# Patient Record
Sex: Male | Born: 1980 | Hispanic: Yes | Marital: Single | Smoking: Former smoker
Health system: Southern US, Community
[De-identification: ages and names within clinical notes are randomized; demographics above are authoritative.]

## PROBLEM LIST (undated history)

## (undated) DIAGNOSIS — H548 Legal blindness, as defined in USA: Secondary | ICD-10-CM

## (undated) DIAGNOSIS — K4090 Unilateral inguinal hernia, without obstruction or gangrene, not specified as recurrent: Secondary | ICD-10-CM

## (undated) DIAGNOSIS — B009 Herpesviral infection, unspecified: Secondary | ICD-10-CM

## (undated) DIAGNOSIS — G039 Meningitis, unspecified: Secondary | ICD-10-CM

## (undated) HISTORY — PX: EYE SURGERY: SHX253

## (undated) HISTORY — DX: Legal blindness, as defined in USA: H54.8

## (undated) HISTORY — PX: BACK SURGERY: SHX140

## (undated) HISTORY — PX: INGUINAL HERNIA REPAIR: SUR1180

## (undated) HISTORY — PX: WISDOM TOOTH EXTRACTION: SHX21

---

## 2014-06-14 DIAGNOSIS — B009 Herpesviral infection, unspecified: Secondary | ICD-10-CM | POA: Insufficient documentation

## 2014-06-14 DIAGNOSIS — H5440 Blindness, one eye, unspecified eye: Secondary | ICD-10-CM | POA: Insufficient documentation

## 2014-10-05 DIAGNOSIS — L301 Dyshidrosis [pompholyx]: Secondary | ICD-10-CM | POA: Insufficient documentation

## 2014-10-05 DIAGNOSIS — E663 Overweight: Secondary | ICD-10-CM | POA: Insufficient documentation

## 2014-11-11 DIAGNOSIS — G629 Polyneuropathy, unspecified: Secondary | ICD-10-CM | POA: Insufficient documentation

## 2016-10-12 DIAGNOSIS — G4482 Headache associated with sexual activity: Secondary | ICD-10-CM | POA: Insufficient documentation

## 2017-04-04 DIAGNOSIS — R7301 Impaired fasting glucose: Secondary | ICD-10-CM | POA: Insufficient documentation

## 2017-04-04 DIAGNOSIS — E785 Hyperlipidemia, unspecified: Secondary | ICD-10-CM | POA: Insufficient documentation

## 2017-04-05 DIAGNOSIS — H5010 Unspecified exotropia: Secondary | ICD-10-CM | POA: Insufficient documentation

## 2017-04-05 DIAGNOSIS — H33051 Total retinal detachment, right eye: Secondary | ICD-10-CM | POA: Insufficient documentation

## 2017-04-05 HISTORY — DX: Total retinal detachment, right eye: H33.051

## 2017-11-08 DIAGNOSIS — M791 Myalgia, unspecified site: Secondary | ICD-10-CM | POA: Insufficient documentation

## 2018-03-09 DIAGNOSIS — G459 Transient cerebral ischemic attack, unspecified: Secondary | ICD-10-CM | POA: Insufficient documentation

## 2018-03-11 DIAGNOSIS — Q211 Atrial septal defect: Secondary | ICD-10-CM | POA: Insufficient documentation

## 2018-03-11 DIAGNOSIS — Q2112 Patent foramen ovale: Secondary | ICD-10-CM | POA: Insufficient documentation

## 2018-03-28 DIAGNOSIS — F32A Depression, unspecified: Secondary | ICD-10-CM | POA: Insufficient documentation

## 2018-03-28 DIAGNOSIS — F419 Anxiety disorder, unspecified: Secondary | ICD-10-CM | POA: Insufficient documentation

## 2018-05-09 ENCOUNTER — Encounter: Payer: Self-pay | Admitting: Emergency Medicine

## 2018-05-09 ENCOUNTER — Emergency Department
Admission: EM | Admit: 2018-05-09 | Discharge: 2018-05-09 | Disposition: A | Discharge status: Home / Self Care | Payer: Medicare (Managed Care) | Attending: Emergency Medicine | Admitting: Emergency Medicine

## 2018-05-09 ENCOUNTER — Other Ambulatory Visit: Payer: Self-pay

## 2018-05-09 DIAGNOSIS — H9201 Otalgia, right ear: Secondary | ICD-10-CM

## 2018-05-09 DIAGNOSIS — R0981 Nasal congestion: Secondary | ICD-10-CM | POA: Diagnosis present

## 2018-05-09 DIAGNOSIS — H6501 Acute serous otitis media, right ear: Secondary | ICD-10-CM | POA: Diagnosis not present

## 2018-05-09 DIAGNOSIS — F17210 Nicotine dependence, cigarettes, uncomplicated: Secondary | ICD-10-CM | POA: Diagnosis not present

## 2018-05-09 DIAGNOSIS — J0100 Acute maxillary sinusitis, unspecified: Secondary | ICD-10-CM | POA: Diagnosis not present

## 2018-05-09 HISTORY — DX: Meningitis, unspecified: G03.9

## 2018-05-09 HISTORY — DX: Herpesviral infection, unspecified: B00.9

## 2018-05-09 HISTORY — DX: Unilateral inguinal hernia, without obstruction or gangrene, not specified as recurrent: K40.90

## 2018-05-09 MED ORDER — AMOXICILLIN 500 MG PO CAPS
500.0000 mg | ORAL_CAPSULE | Freq: Once | ORAL | Status: AC
  Administered 2018-05-09: 500 mg via ORAL
  Filled 2018-05-09: qty 1

## 2018-05-09 MED ORDER — AMOXICILLIN 500 MG PO CAPS: 500.0000 mg | ORAL_CAPSULE | Freq: Three times a day (TID) | ORAL | 0 refills | Status: DC

## 2018-05-09 MED ORDER — PSEUDOEPHEDRINE HCL ER 120 MG PO TB12: 120.0000 mg | ORAL_TABLET | Freq: Once | ORAL | Status: DC

## 2018-05-09 MED ORDER — BENZONATATE 100 MG PO CAPS: ORAL_CAPSULE | ORAL | 0 refills | Status: DC

## 2018-05-09 MED ORDER — FLUTICASONE PROPIONATE 50 MCG/ACT NA SUSP: 2.0000 | Freq: Every day | NASAL | 0 refills | Status: DC

## 2018-05-09 MED ORDER — CETIRIZINE-PSEUDOEPHEDRINE ER 5-120 MG PO TB12: 1.0000 | ORAL_TABLET | Freq: Two times a day (BID) | ORAL | 0 refills | Status: AC

## 2018-05-09 MED ORDER — DIPHENHYDRAMINE HCL 25 MG PO CAPS
50.0000 mg | ORAL_CAPSULE | Freq: Once | ORAL | Status: AC
  Administered 2018-05-09: 50 mg via ORAL
  Filled 2018-05-09: qty 2

## 2018-05-09 NOTE — Discharge Instructions (Signed)
You are being treated for a sinus infections. Take the meds as directed. Follow-up with your provider as needed.

## 2018-05-09 NOTE — ED Triage Notes (Signed)
Pt comes into the ED via POV c/o cough, nasal congestion, and otalgia bilaterally.  Patient states this has been ongoing for a week and it feels likes pressure in his ears.  Patient in NAD at this time with even and unlabored respirations.

## 2018-05-13 NOTE — ED Provider Notes (Signed)
[facility] Emergency Department Provider Note ____________________________________________  Time seen: 1945  I have reviewed the triage vital signs and the nursing notes.  HISTORY  Chief Complaint  Nasal Congestion and Otalgia  HPI Dakota Stanley is a 37 y.o. male presents to the ED accompanied by his family, for evaluation of cough, congestion, and bilateral ear pain.  He describes sharply worsening pain on the right as well as decreased hearing, after he pinched his nose and blew air through his ears.  He reports malaise, fatigue, and subjective fevers in the last week.  He has taken over-the-counter allergy medicine without significant benefit.  No chest pain, shortness of breath, or abdominal pain is reported.  Past Medical History:  Diagnosis Date  . HSV-1 (herpes simplex virus 1) infection   . Inguinal hernia   . Meningitis     There are no active problems to display for this patient.   Past Surgical History:  Procedure Laterality Date  . BACK SURGERY    . EYE SURGERY      Prior to Admission medications   Medication Sig Start Date End Date Taking? Authorizing Provider  amoxicillin (AMOXIL) 500 MG capsule Take 1 capsule (500 mg total) by mouth 3 (three) times daily. 05/09/18   Deloras Reichard, Charlesetta Ivory, PA-C  benzonatate (TESSALON PERLES) 100 MG capsule Take 1-2 tabs TID prn cough 05/09/18   Hendry Speas, Charlesetta Ivory, PA-C  cetirizine-pseudoephedrine (ZYRTEC-D) 5-120 MG tablet Take 1 tablet by mouth 2 (two) times daily for 15 days. 05/09/18 05/24/18  Reginald Weida, Charlesetta Ivory, PA-C  fluticasone (FLONASE) 50 MCG/ACT nasal spray Place 2 sprays into both nostrils daily. 05/09/18   Jaleeah Slight, Charlesetta Ivory, PA-C    Allergies Crab [shellfish allergy] and Morphine and related  No family history on file.  Social History Social History   Tobacco Use  . Smoking status: Current Every Day Smoker    Types: E-cigarettes  . Smokeless tobacco: Never Used  Substance Use  Topics  . Alcohol use: Yes  . Drug use: Yes    Review of Systems  Constitutional: Negative for fever. Eyes: Negative for visual changes.  Notes nasal congestion. ENT: Negative for sore throat.  Reports bilateral otalgia Cardiovascular: Negative for chest pain. Respiratory: Negative for shortness of breath.  Reports nonproductive cough. Gastrointestinal: Negative for abdominal pain, vomiting and diarrhea. Musculoskeletal: Negative for back pain. Skin: Negative for rash. Neurological: Negative for headaches, focal weakness or numbness. ____________________________________________  PHYSICAL EXAM:  VITAL SIGNS: ED Triage Vitals  Enc Vitals Group     BP 05/09/18 2116 (!) 136/97     Pulse Rate 05/09/18 2116 84     Resp 05/09/18 2116 18     Temp 05/09/18 2116 98.3 F (36.8 C)     Temp Source 05/09/18 2116 Oral     SpO2 05/09/18 2116 98 %     Weight 05/09/18 1909 185 lb (83.9 kg)     Height 05/09/18 1909 6' (1.829 m)     Head Circumference --      Peak Flow --      Pain Score 05/09/18 1909 8     Pain Loc --      Pain Edu? --      Excl. in GC? --     Constitutional: Alert and oriented. Well appearing and in no distress. Head: Normocephalic and atraumatic. Maxillary sinus tenderness on palpation & percussion Eyes: Conjunctivae are normal. PERRL. Normal extraocular movements Ears: Canals clear. TMs intact bilaterally. Right  TM is bulging with a serous effusion noted Nose: No congestion/rhinorrhea/epistaxis. Nasal turbinates are erythematous and edematous Mouth/Throat: Mucous membranes are moist. Uvula is midline and tonsils are flat. No oral lesions are noted.  Neck: Supple. No thyromegaly. Hematological/Lymphatic/Immunological: No cervical lymphadenopathy. Cardiovascular: Normal rate, regular rhythm. Normal distal pulses. Respiratory: Normal respiratory effort. No wheezes/rales/rhonchi. Gastrointestinal: Soft and nontender. No distention. Skin:  Skin is warm, dry and intact.  No rash noted. ____________________________________________  PROCEDURES  Procedures Amoxicillin 500 mg PO Diphenhydramine 25 mg PO ____________________________________________  INITIAL IMPRESSION / ASSESSMENT AND PLAN / ED COURSE  Patient with ED evaluation of sinus congestion and otalgia. His exam is consistent with likely acute sinusitis and an acute serous effusion. He will be discharged with  prescriptions for amoxicillin, benzonatate, Flonase, and Zyrtec-D. He will follow-up with a local clinic for ongoing symptoms.  ____________________________________________  FINAL CLINICAL IMPRESSION(S) / ED DIAGNOSES  Final diagnoses:  Acute non-recurrent maxillary sinusitis  Otalgia of right ear  Non-recurrent acute serous otitis media of right ear      Lissa HoardMenshew, Livvy Spilman V Bacon, PA-C 05/13/18 1703    Jeanmarie PlantMcShane, James A, MD 05/23/18 1540

## 2018-05-26 ENCOUNTER — Emergency Department
Admission: EM | Admit: 2018-05-26 | Discharge: 2018-05-26 | Disposition: A | Discharge status: Home / Self Care | Payer: Medicare (Managed Care) | Attending: Student in an Organized Health Care Education/Training Program | Admitting: Student in an Organized Health Care Education/Training Program

## 2018-05-26 ENCOUNTER — Emergency Department: Payer: Medicare (Managed Care)

## 2018-05-26 ENCOUNTER — Encounter: Payer: Self-pay | Admitting: Emergency Medicine

## 2018-05-26 DIAGNOSIS — S92515A Nondisplaced fracture of proximal phalanx of left lesser toe(s), initial encounter for closed fracture: Secondary | ICD-10-CM | POA: Insufficient documentation

## 2018-05-26 DIAGNOSIS — Y998 Other external cause status: Secondary | ICD-10-CM | POA: Insufficient documentation

## 2018-05-26 DIAGNOSIS — F17290 Nicotine dependence, other tobacco product, uncomplicated: Secondary | ICD-10-CM | POA: Insufficient documentation

## 2018-05-26 DIAGNOSIS — Z79899 Other long term (current) drug therapy: Secondary | ICD-10-CM | POA: Diagnosis not present

## 2018-05-26 DIAGNOSIS — Y9301 Activity, walking, marching and hiking: Secondary | ICD-10-CM | POA: Insufficient documentation

## 2018-05-26 DIAGNOSIS — Y9289 Other specified places as the place of occurrence of the external cause: Secondary | ICD-10-CM | POA: Diagnosis not present

## 2018-05-26 DIAGNOSIS — W108XXA Fall (on) (from) other stairs and steps, initial encounter: Secondary | ICD-10-CM | POA: Diagnosis not present

## 2018-05-26 DIAGNOSIS — S99922A Unspecified injury of left foot, initial encounter: Secondary | ICD-10-CM | POA: Diagnosis present

## 2018-05-26 MED ORDER — MELOXICAM 15 MG PO TABS: 15.0000 mg | ORAL_TABLET | Freq: Every day | ORAL | 1 refills | Status: AC

## 2018-05-26 MED ORDER — HYDROCODONE-ACETAMINOPHEN 5-325 MG PO TABS: 1.0000 | ORAL_TABLET | Freq: Four times a day (QID) | ORAL | 0 refills | Status: AC | PRN

## 2018-05-26 NOTE — ED Triage Notes (Signed)
Pt c/o right foot and 4th toe pain x1 week when pt reports he fell down a flight of stairs. Swelling noted tot he 4th toe.

## 2018-05-26 NOTE — ED Provider Notes (Signed)
[facility] Emergency Department Provider Note  ____________________________________________  Time seen: Approximately 9:21 PM  I have reviewed the triage vital signs and the nursing notes.   HISTORY  Chief Complaint Toe Injury    HPI Dara HoyerJimmy Andonian is a 37 y.o. male presents to the emergency department with right fourth toe pain for approximately 1 week after patient reports that he fell down some stairs.  Patient  has noticed that right fourth toe appears swollen and has noticed some bruising.  Patient reports that toe pain has not detrimentally affected activities of daily living and he has been ambulating without difficulty.  No alleviating measures have been attempted.   Past Medical History:  Diagnosis Date  . HSV-1 (herpes simplex virus 1) infection   . Inguinal hernia   . Meningitis     There are no active problems to display for this patient.   Past Surgical History:  Procedure Laterality Date  . BACK SURGERY    . EYE SURGERY      Prior to Admission medications   Medication Sig Start Date End Date Taking? Authorizing Provider  amoxicillin (AMOXIL) 500 MG capsule Take 1 capsule (500 mg total) by mouth 3 (three) times daily. 05/09/18   Menshew, Charlesetta IvoryJenise V Bacon, PA-C  benzonatate (TESSALON PERLES) 100 MG capsule Take 1-2 tabs TID prn cough 05/09/18   Menshew, Charlesetta IvoryJenise V Bacon, PA-C  fluticasone (FLONASE) 50 MCG/ACT nasal spray Place 2 sprays into both nostrils daily. 05/09/18   Menshew, Charlesetta IvoryJenise V Bacon, PA-C  HYDROcodone-acetaminophen (NORCO) 5-325 MG tablet Take 1 tablet by mouth every 6 (six) hours as needed for up to 3 days for moderate pain. 05/26/18 05/29/18  Orvil FeilWoods, Jaclyn M, PA-C  meloxicam (MOBIC) 15 MG tablet Take 1 tablet (15 mg total) by mouth daily for 7 days. 05/26/18 06/02/18  Orvil FeilWoods, Jaclyn M, PA-C    Allergies Crab [shellfish allergy] and Morphine and related  History reviewed. No pertinent family history.  Social History Social History    Tobacco Use  . Smoking status: Current Every Day Smoker    Types: E-cigarettes  . Smokeless tobacco: Never Used  Substance Use Topics  . Alcohol use: Yes  . Drug use: Yes     Review of Systems  Constitutional: No fever/chills Eyes: No visual changes. No discharge ENT: No upper respiratory complaints. Cardiovascular: no chest pain. Respiratory: no cough. No SOB. Gastrointestinal: No abdominal pain.  No nausea, no vomiting.  No diarrhea.  No constipation. Musculoskeletal: Patient has right 4th toe pain.  Skin: Negative for rash, abrasions, lacerations, ecchymosis. Neurological: Negative for headaches, focal weakness or numbness.   ____________________________________________   PHYSICAL EXAM:  VITAL SIGNS: ED Triage Vitals  Enc Vitals Group     BP 05/26/18 1954 125/82     Pulse Rate 05/26/18 1954 (!) 101     Resp 05/26/18 1954 20     Temp 05/26/18 1954 98.8 F (37.1 C)     Temp Source 05/26/18 1954 Oral     SpO2 05/26/18 1954 98 %     Weight 05/26/18 1955 185 lb (83.9 kg)     Height 05/26/18 1955 6' (1.829 m)     Head Circumference --      Peak Flow --      Pain Score 05/26/18 2009 8     Pain Loc --      Pain Edu? --      Excl. in GC? --      Constitutional: Alert and oriented. Well  appearing and in no acute distress. Eyes: Conjunctivae are normal. PERRL. EOMI. Head: Atraumatic.  Cardiovascular: Normal rate, regular rhythm. Normal S1 and S2.  Good peripheral circulation. Respiratory: Normal respiratory effort without tachypnea or retractions. Lungs CTAB. Good air entry to the bases with no decreased or absent breath sounds. Musculoskeletal: Full range of motion to all extremities. No gross deformities appreciated.  Patient has ecchymosis at the base of right fourth toe.  Palpable dorsalis pedis pulse, right. Neurologic:  Normal speech and language. No gross focal neurologic deficits are appreciated.  Skin:  Skin is warm, dry and intact. No rash  noted. Psychiatric: Mood and affect are normal. Speech and behavior are normal. Patient exhibits appropriate insight and judgement.   ____________________________________________   LABS (all labs ordered are listed, but only abnormal results are displayed)  Labs Reviewed - No data to display ____________________________________________  EKG   ____________________________________________  RADIOLOGY I personally viewed and evaluated these images as part of my medical decision making, as well as reviewing the written report by the radiologist.  Dg Foot Complete Left  Result Date: 05/26/2018 CLINICAL DATA:  Left fourth toe pain after recent fall EXAM: LEFT FOOT - COMPLETE 3+ VIEW COMPARISON:  None. FINDINGS: Oblique non articular proximal phalanx fracture in the left fourth toe with surrounding soft tissue swelling and with 2 mm lateral displacement of the distal fracture fragment. No additional fracture. No dislocation. No suspicious focal osseous lesion. No significant arthropathy. No radiopaque foreign body. IMPRESSION: Oblique mildly displaced non articular proximal phalanx left fourth toe fracture. Electronically Signed   By: Delbert Phenix M.D.   On: 05/26/2018 20:30    ____________________________________________    PROCEDURES  Procedure(s) performed:    Procedures    Medications - No data to display   ____________________________________________   INITIAL IMPRESSION / ASSESSMENT AND PLAN / ED COURSE  Pertinent labs & imaging results that were available during my care of the patient were reviewed by me and considered in my medical decision making (see chart for details).  Review of the [place] CSRS was performed in accordance of the [facility] prior to dispensing any controlled drugs.      Assessment and Plan: Right fourth toe pain Patient presents to the emergency department with right fourth toe pain after he reportedly fell down some steps approximately a week ago.   X-ray examination is concerning for an oblique proximal phalanx fracture.  Patient was splinted in the emergency department using buddy tape and a short course of Norco was provided for pain.  Patient was advised to follow-up with podiatry.  All patient questions were answered.     ____________________________________________  FINAL CLINICAL IMPRESSION(S) / ED DIAGNOSES  Final diagnoses:  Closed nondisplaced fracture of proximal phalanx of lesser toe of left foot, initial encounter      NEW MEDICATIONS STARTED DURING THIS VISIT:  ED Discharge Orders        Ordered    meloxicam (MOBIC) 15 MG tablet  Daily     05/26/18 2045    HYDROcodone-acetaminophen (NORCO) 5-325 MG tablet  Every 6 hours PRN     05/26/18 2045          This chart was dictated using voice recognition software/Dragon. Despite best efforts to proofread, errors can occur which can change the meaning. Any change was purely unintentional.    Gasper Lloyd 05/26/18 2126    Willy Eddy, MD 05/26/18 2222

## 2018-07-15 ENCOUNTER — Other Ambulatory Visit (HOSPITAL_COMMUNITY)
Admission: RE | Admit: 2018-07-15 | Discharge: 2018-07-15 | Disposition: A | Discharge status: Home / Self Care | Payer: Medicare (Managed Care) | Source: Ambulatory Visit | Attending: Nurse Practitioner | Admitting: Nurse Practitioner

## 2018-07-15 ENCOUNTER — Ambulatory Visit (INDEPENDENT_AMBULATORY_CARE_PROVIDER_SITE_OTHER): Payer: Medicare (Managed Care) | Admitting: Nurse Practitioner

## 2018-07-15 ENCOUNTER — Other Ambulatory Visit: Payer: Self-pay

## 2018-07-15 ENCOUNTER — Encounter: Payer: Self-pay | Admitting: Nurse Practitioner

## 2018-07-15 VITALS — BP 129/78 | HR 83 | Temp 98.3°F | Ht 71.0 in | Wt 177.4 lb

## 2018-07-15 DIAGNOSIS — B009 Herpesviral infection, unspecified: Secondary | ICD-10-CM | POA: Insufficient documentation

## 2018-07-15 DIAGNOSIS — H5440 Blindness, one eye, unspecified eye: Secondary | ICD-10-CM

## 2018-07-15 DIAGNOSIS — Z8661 Personal history of infections of the central nervous system: Secondary | ICD-10-CM | POA: Insufficient documentation

## 2018-07-15 DIAGNOSIS — Z202 Contact with and (suspected) exposure to infections with a predominantly sexual mode of transmission: Secondary | ICD-10-CM | POA: Diagnosis not present

## 2018-07-15 DIAGNOSIS — Z7689 Persons encountering health services in other specified circumstances: Secondary | ICD-10-CM | POA: Diagnosis not present

## 2018-07-15 DIAGNOSIS — Q2112 Patent foramen ovale: Secondary | ICD-10-CM

## 2018-07-15 DIAGNOSIS — Q211 Atrial septal defect: Secondary | ICD-10-CM

## 2018-07-15 DIAGNOSIS — H5450 Low vision, one eye, unspecified eye: Secondary | ICD-10-CM

## 2018-07-15 DIAGNOSIS — H548 Legal blindness, as defined in USA: Secondary | ICD-10-CM | POA: Insufficient documentation

## 2018-07-15 MED ORDER — VALACYCLOVIR HCL 500 MG PO TABS: 500.0000 mg | ORAL_TABLET | Freq: Two times a day (BID) | ORAL | 5 refills | Status: DC

## 2018-07-15 NOTE — Progress Notes (Signed)
Subjective:    Patient ID: Dara HoyerJimmy Galan, male    DOB: 12/08/1980, 37 y.o.   MRN: [id number]  Dara HoyerJimmy Stohr is a 37 y.o. male presenting on 07/15/2018 for Establish Care (recently located to this area)   HPI Establish Care New Provider Pt last seen by PCP [place], [place] [facility] ph [phone] last seen about 5 months ago.  Obtain records. [facility] - Social services is working to get [employer] coverage.  [place]  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  He is unsure if this is related to optic nerve or to brain function.  He has complete vision loss of R eye.  Left eye is useful for central vision only and has been stable without change since that time.  He continues to drive [place] driver's license.  He admits he is compensating for vision loss as he can sense and feel people and objects around him.  Continues ophthalmology visit annually.  Patent Foramen Ovale Last year: he presented to hospital with stroke like symptoms that were transient.  Workup was negative for stroke except for finding of patent foramen ovale.    HSV 1  No prior cold sores in past.  He continues on suppression therapy to prevent changes in eyesight.  Patient is poor historian about this association.  Takes valtrex 500 mg bid.  Patient states "it went into meninges," but is unable to expand on what this means. Neurology has cleared patient per patient report.    STD screening Patient reports having end to prior relationship with his [place].  He suspects possible infidelity.  Desires STD testing.  No lumps/bumps/discharge noted.  No  symptoms.  Past Medical History:  Diagnosis Date  . HSV-1 (herpes simplex virus 1) infection   . Inguinal hernia   . Legally blind   . Meningitis    Past Surgical History:  Procedure Laterality Date  . BACK SURGERY    . EYE SURGERY    . INGUINAL HERNIA REPAIR     Family History  Problem Relation Age of Onset  . Hypertension Mother   . Cerebral aneurysm Father   . Healthy Sister   . Healthy Brother   . Healthy Brother    Social History   Tobacco Use  . Smoking status: Current Some Day Smoker    Types: E-cigarettes  . Smokeless tobacco: Never Used  . [facility] comment: [facility]  Substance Use Topics  . Alcohol use: Yes    Comment: occassionally, social once per month 15 beverages over 8 hours  . Drug use: Yes    Types: Marijuana    Comment: vapor/oil with THC    Review of Systems  Constitutional: Negative for appetite change, fatigue and unexpected weight change.  HENT: Negative for congestion, dental problem, ear pain, hearing loss, rhinorrhea and trouble swallowing.   Eyes: Positive for visual disturbance (blindness).  Respiratory: Negative for cough, chest tightness and shortness of breath.   Cardiovascular: Negative for chest pain, palpitations and leg swelling.  Gastrointestinal: Negative for abdominal distention, constipation, diarrhea, nausea and vomiting.  Endocrine: Negative for polydipsia, polyphagia and polyuria.  Genitourinary: Negative  for difficulty urinating, discharge, genital sores, penile pain, penile swelling, scrotal swelling and testicular pain.  Musculoskeletal: Negative for arthralgias and back pain.  Skin: Negative for color change and rash.  Neurological: Negative for dizziness.  Psychiatric/Behavioral: Negative for agitation, dysphoric mood and self-injury.   Per HPI unless specifically indicated above     Objective:    BP 129/78 (BP Location: Right Arm, Patient Position: Sitting, Cuff Size: Normal)   Pulse 83    Temp 98.3 F (36.8 C) (Oral)   Ht 5\' 11"  (1.803 m)   Wt 177 lb 6.4 oz (80.5 kg)   BMI 24.74 kg/m   Wt Readings from Last 3 Encounters:  07/15/18 177 lb 6.4 oz (80.5 kg)  05/26/18 185 lb (83.9 kg)  05/09/18 185 lb (83.9 kg)    Physical Exam  Constitutional: He is oriented to person, place, and time. He appears well-developed and well-nourished. No distress.  HENT:  Head: Normocephalic and atraumatic.  Eyes: Conjunctivae and lids are normal.  R eye with clouded / opaque lens  Cardiovascular: Normal rate, regular rhythm, S1 normal, S2 normal, normal heart sounds and intact distal pulses.  Pulmonary/Chest: Effort normal and breath sounds normal. No respiratory distress.  Neurological: He is alert and oriented to person, place, and time.  Skin: Skin is warm and dry. Capillary refill takes less than 2 seconds.  Psychiatric: He has a normal mood and affect. His behavior is normal. Judgment and thought content normal.  Vitals reviewed.      Assessment & Plan:   Problem List Items Addressed This Visit      Cardiovascular and Mediastinum   PFO (patent foramen ovale) Murmur noted, recent eval without complications.  Follow-up prn.     Other   Legally blind Patient is very poor historian regarding history of events leading to blindness.  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  Plan: 1. Continue with neurology as needed. 2. Recommend regular followup with ophthalmology.  Self referral info provided. 3. Follow-up prn.  Encouraged patient that he will need new driver's license and if legally blind and without proper correction, should not drive.    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  Plan: 1. Continue valtrex 500 mg bid. 2. Consider future infectious diseases referral. 3.  CBC today. 4. Follow-up 6 months.   Relevant Medications   valACYclovir (VALTREX) 500 MG tablet   Other Relevant Orders   CBC with Differential/Platelet (Completed)   Personal history of meningitis See AP blindness above.    Other Visit Diagnoses    Possible exposure to STD    -  Primary Patient with new relationship status change, concerned for possible infidelity in previous relationship.  Currently symptom free.  Plan: 1. STD testing per orders below. 2. Encouraged patient to continue safe sex practices. 3. Follow-up after labs for treatment prn.   Relevant Orders   Urine cytology ancillary only (Completed)   Hepatitis C antibody (Completed)   HIV antibody (Completed)   RPR (Completed)   Encounter to establish care     [place]  Records will be requested.  Past medical, family, and surgical history reviewed w/ pt.        Follow up plan: Return in about 6 months (around 01/15/2019) for HSV 1.  Wilhelmina McardleLauren Kiandre Spagnolo, DNP, AGPCNP-BC Adult Gerontology Primary  Care Nurse Practitioner [facility] 07/15/2018, 10:24 AM

## 2018-07-15 NOTE — Patient Instructions (Addendum)
Leia AlfJimmy D Pompa,   Thank you for coming in to clinic today.  1. Ophthalmology: [facility] [address], [place], [place] [address] Phone: [phone] [email or web address]  2. Continue valtrex 500 mg twice daily.  Please schedule a follow-up appointment with Wilhelmina McardleLauren Calinda Stockinger, AGNP. Return in about 6 months (around 01/15/2019) for HSV 1.  If you have any other questions or concerns, please feel free to call the clinic or send a message through MyChart. You may also schedule an earlier appointment if necessary.  You will receive a survey after today's visit either digitally by e-mail or paper by [employer] mail. Your experiences and feedback matter to us.  Please respond so we know how we are doing as we provide care for you.   Wilhelmina McardleLauren Epifania Littrell, DNP, AGNP-BC Adult Gerontology Nurse Practitioner [facility], [facility]

## 2018-07-16 LAB — CBC WITH DIFFERENTIAL/PLATELET
Basophils Absolute: 38 cells/uL (ref 0–200)
Basophils Relative: 0.8 %
Eosinophils Absolute: 101 cells/uL (ref 15–500)
Eosinophils Relative: 2.1 %
HCT: 42.4 % (ref 38.5–50.0)
Hemoglobin: 14.8 g/dL (ref 13.2–17.1)
Lymphs Abs: 826 cells/uL — ABNORMAL LOW (ref 850–3900)
MCH: 30.1 pg (ref 27.0–33.0)
MCHC: 34.9 g/dL (ref 32.0–36.0)
MCV: 86.4 fL (ref 80.0–100.0)
MPV: 9.4 fL (ref 7.5–12.5)
Monocytes Relative: 10.4 %
Neutro Abs: 3336 cells/uL (ref 1500–7800)
Neutrophils Relative %: 69.5 %
Platelets: 266 10*3/uL (ref 140–400)
RBC: 4.91 10*6/uL (ref 4.20–5.80)
RDW: 13.2 % (ref 11.0–15.0)
Total Lymphocyte: 17.2 %
WBC mixed population: 499 cells/uL (ref 200–950)
WBC: 4.8 10*3/uL (ref 3.8–10.8)

## 2018-07-16 LAB — HEPATITIS C ANTIBODY
Hepatitis C Ab: NONREACTIVE
SIGNAL TO CUT-OFF: 0.02 (ref <1.00)

## 2018-07-16 LAB — URINE CYTOLOGY ANCILLARY ONLY
Chlamydia: NEGATIVE
Neisseria Gonorrhea: NEGATIVE
Trichomonas: NEGATIVE

## 2018-07-16 LAB — RPR: RPR Ser Ql: NONREACTIVE

## 2018-07-16 LAB — HIV ANTIBODY (ROUTINE TESTING W REFLEX): HIV 1&2 Ab, 4th Generation: NONREACTIVE

## 2018-09-09 DIAGNOSIS — R6889 Other general symptoms and signs: Secondary | ICD-10-CM | POA: Diagnosis not present

## 2018-10-15 ENCOUNTER — Encounter: Payer: Self-pay | Admitting: Nurse Practitioner

## 2019-01-15 ENCOUNTER — Other Ambulatory Visit: Payer: Self-pay

## 2019-01-15 ENCOUNTER — Ambulatory Visit (INDEPENDENT_AMBULATORY_CARE_PROVIDER_SITE_OTHER): Payer: Medicare HMO | Admitting: Nurse Practitioner

## 2019-01-15 ENCOUNTER — Encounter: Payer: Self-pay | Admitting: Nurse Practitioner

## 2019-01-15 ENCOUNTER — Other Ambulatory Visit (HOSPITAL_COMMUNITY)
Admission: RE | Admit: 2019-01-15 | Discharge: 2019-01-15 | Disposition: A | Discharge status: Home / Self Care | Payer: Medicare HMO | Source: Ambulatory Visit | Attending: Nurse Practitioner | Admitting: Nurse Practitioner

## 2019-01-15 VITALS — BP 128/78 | HR 90 | Temp 98.5°F | Ht 71.0 in | Wt 173.8 lb

## 2019-01-15 DIAGNOSIS — Z113 Encounter for screening for infections with a predominantly sexual mode of transmission: Secondary | ICD-10-CM

## 2019-01-15 DIAGNOSIS — Z23 Encounter for immunization: Secondary | ICD-10-CM

## 2019-01-15 DIAGNOSIS — Z136 Encounter for screening for cardiovascular disorders: Secondary | ICD-10-CM

## 2019-01-15 DIAGNOSIS — Z130 Encounter for screening for diseases of the blood and blood-forming organs and certain disorders involving the immune mechanism: Secondary | ICD-10-CM

## 2019-01-15 DIAGNOSIS — Z13228 Encounter for screening for other metabolic disorders: Secondary | ICD-10-CM | POA: Diagnosis not present

## 2019-01-15 DIAGNOSIS — Z13220 Encounter for screening for lipoid disorders: Secondary | ICD-10-CM

## 2019-01-15 DIAGNOSIS — Z1329 Encounter for screening for other suspected endocrine disorder: Secondary | ICD-10-CM

## 2019-01-15 DIAGNOSIS — Z0000 Encounter for general adult medical examination without abnormal findings: Secondary | ICD-10-CM | POA: Diagnosis not present

## 2019-01-15 NOTE — Patient Instructions (Addendum)
Dakota Stanley,   Thank you for coming in to clinic today.  1. Continue practicing safe sex with one partner, using condoms to prevent some STD transmission.  2. Continue healthy and active lifestyle.  Please schedule a follow-up appointment with Wilhelmina Mcardle, AGNP. Return for at pt's next convenience for erectile dysfunction and  hemorrhoids.  If you have any other questions or concerns, please feel free to call the clinic or send a message through MyChart. You may also schedule an earlier appointment if necessary.  You will receive a survey after today's visit either digitally by e-mail or paper by [employer]. Your experiences and feedback matter to [place].  Please respond so we know how we are doing as we provide care for you.   Wilhelmina Mcardle, DNP, AGNP-BC Adult Gerontology Nurse Practitioner [facility], [facility]

## 2019-01-15 NOTE — Progress Notes (Signed)
Subjective:    Patient ID: Dakota Stanley, male    DOB: 07/09/1981, 38 y.o.   MRN: [id number]  Dakota Stanley is a 38 y.o. male presenting on 01/15/2019 for Annual Exam (ED issue, unprotected sex w/ like STD check ) and Hemorrhoids (no complications x 1 yr)  HPI Annual Physical Exam Patient has been feeling well.  They have acute concerns today for STD check, ED problems, hemorrhoids.  Sleeps about 8 hours per night usually uninterrupted.  HEALTH MAINTENANCE: Weight/BMI: healthy Physical activity: stays regularly physically active Diet: Generally healthy, regular diet Seatbelt: always Sunscreen: regularly with prolonged exposure Prostate exam/PSA: no family history Colon Cancer Screen: no family history HIV/HEP C: due for STD check Optometry: last year - no follow-up yet this year - follows regularly Dentistry: regular  VACCINES: Tetanus: up to date Influenza: desires today Gardasil: declines today  STD screening Patient has had 5 partners in last year.  Practices monogamous relationships. - Straight male - Patient participates in oral and vaginal sex. - No lumps, bumps, sores, or penile discharge noted.  - Patient states he uses condoms always, but had unprotected sex once when drunk, so is reason he desires testing.    Past Medical History:  Diagnosis Date  . HSV-1 (herpes simplex virus 1) infection   . Inguinal hernia   . Legally blind   . Meningitis    Past Surgical History:  Procedure Laterality Date  . BACK SURGERY    . EYE SURGERY    . INGUINAL HERNIA REPAIR     Social History   Socioeconomic History  . Marital status: Single    Spouse name: Not on file  . Number of children: Not on file  . Years of education: Not on file  . Highest education level: Not on file  Occupational History  . Occupation: unemployed - disabled  Social Needs  . Financial resource strain: Not on file  . Food insecurity:    Worry: Not on file    Inability: Not on file  .  Transportation needs:    Medical: Not on file    Non-medical: Not on file  Tobacco Use  . Smoking status: Current Some Day Smoker    Types: E-cigarettes  . Smokeless tobacco: Never Used  . Tobacco comment: Single cartridge over with THC  Substance and Sexual Activity  . Alcohol use: Yes    Comment: occassionally, social once per month 15 beverages over 8 hours  . Drug use: Yes    Types: Marijuana    Comment: vapor/oil with THC  . Sexual activity: Not Currently  Lifestyle  . Physical activity:    Days per week: Not on file    Minutes per session: Not on file  . Stress: Not on file  Relationships  . Social connections:    Talks on phone: Not on file    Gets together: Not on file    Attends religious service: Not on file    Active member of club or organization: Not on file    Attends meetings of clubs or organizations: Not on file    Relationship status: Not on file  . Intimate partner violence:    Fear of current or ex partner: Not on file    Emotionally abused: Not on file    Physically abused: Not on file    Forced sexual activity: Not on file  Other Topics Concern  . Not on file  Social History Narrative  . Not on file  Family History  Problem Relation Age of Onset  . Hypertension Mother   . Cerebral aneurysm Father   . Healthy Sister   . Healthy Brother   . Healthy Brother    Current Outpatient Medications on File Prior to Visit  Medication Sig  . Glycerin-Hypromellose-PEG 400 (ARTIFICIAL TEARS) 0.2-0.2-1 % SOLN Apply to eye.  . Multiple Vitamins-Minerals (MULTIVITAMIN ADULT PO) Take by mouth.  . valACYclovir (VALTREX) 500 MG tablet Take 1 tablet (500 mg total) by mouth 2 (two) times daily.   No current facility-administered medications on file prior to visit.     Review of Systems Per HPI unless specifically indicated above     Objective:    BP 128/78 (BP Location: Right Arm, Patient Position: Sitting, Cuff Size: Normal)   Pulse 90   Temp 98.5 F  (36.9 C) (Oral)   Ht 5\' 11"  (1.803 m)   Wt 173 lb 12.8 oz (78.8 kg)   BMI 24.24 kg/m   Wt Readings from Last 3 Encounters:  01/15/19 173 lb 12.8 oz (78.8 kg)  07/15/18 177 lb 6.4 oz (80.5 kg)  05/26/18 185 lb (83.9 kg)    Physical Exam Vitals signs and nursing note reviewed.  Constitutional:      General: He is not in acute distress.    Appearance: He is well-developed and normal weight.  HENT:     Head: Normocephalic and atraumatic.     Right Ear: External ear normal.     Left Ear: External ear normal.     Nose: Nose normal.     Mouth/Throat:     Mouth: Mucous membranes are moist.     Pharynx: Oropharynx is clear. No oropharyngeal exudate or posterior oropharyngeal erythema.  Eyes:     General: Lids are normal.     Extraocular Movements: Extraocular movements intact.     Conjunctiva/sclera: Conjunctivae normal.     Pupils: Pupils are equal, round, and reactive to light.     Comments: RIGHT lenticular opacity  Neck:     Musculoskeletal: Normal range of motion and neck supple.     Thyroid: No thyromegaly.     Vascular: No JVD.     Trachea: No tracheal deviation.  Cardiovascular:     Rate and Rhythm: Normal rate and regular rhythm.     Heart sounds: Normal heart sounds. No murmur. No friction rub. No gallop.   Pulmonary:     Effort: Pulmonary effort is normal. No respiratory distress.     Breath sounds: Normal breath sounds.  Abdominal:     General: Abdomen is flat. Bowel sounds are normal. There is no distension.     Palpations: Abdomen is soft.     Tenderness: There is no abdominal tenderness.  Musculoskeletal: Normal range of motion.  Lymphadenopathy:     Cervical: No cervical adenopathy.  Skin:    General: Skin is warm and dry.     Capillary Refill: Capillary refill takes less than 2 seconds.  Neurological:     General: No focal deficit present.     Mental Status: He is alert and oriented to person, place, and time. Mental status is at baseline.     Cranial  Nerves: No cranial nerve deficit.  Psychiatric:        Mood and Affect: Mood normal.        Behavior: Behavior normal.        Thought Content: Thought content normal.        Judgment: Judgment normal.  Results for orders placed or performed in visit on 07/15/18  Hepatitis C antibody  Result Value Ref Range   Hepatitis C Ab NON-REACTIVE NON-REACTI   SIGNAL TO CUT-OFF 0.02 <1.00  HIV antibody  Result Value Ref Range   HIV 1&2 Ab, 4th Generation NON-REACTIVE NON-REACTI  RPR  Result Value Ref Range   RPR Ser Ql NON-REACTIVE NON-REACTI  CBC with Differential/Platelet  Result Value Ref Range   WBC 4.8 3.8 - 10.8 Thousand/uL   RBC 4.91 4.20 - 5.80 Million/uL   Hemoglobin 14.8 13.2 - 17.1 g/dL   HCT 40.942.4 81.138.5 - 91.450.0 %   MCV 86.4 80.0 - 100.0 fL   MCH 30.1 27.0 - 33.0 pg   MCHC 34.9 32.0 - 36.0 g/dL   RDW 78.213.2 95.611.0 - 21.315.0 %   Platelets 266 140 - 400 Thousand/uL   MPV 9.4 7.5 - 12.5 fL   Neutro Abs 3,336 1,500 - 7,800 cells/uL   Lymphs Abs 826 (L) 850 - 3,900 cells/uL   WBC mixed population 499 200 - 950 cells/uL   Eosinophils Absolute 101 15 - 500 cells/uL   Basophils Absolute 38 0 - 200 cells/uL   Neutrophils Relative % 69.5 %   Total Lymphocyte 17.2 %   Monocytes Relative 10.4 %   Eosinophils Relative 2.1 %   Basophils Relative 0.8 %  Urine cytology ancillary only  Result Value Ref Range   Chlamydia Negative    Neisseria gonorrhea Negative    Trichomonas Negative       Assessment & Plan:   Problem List Items Addressed This Visit    None    Visit Diagnoses    Encounter for annual physical exam    -  Primary   Relevant Orders   TSH   Lipid panel   CBC with Differential/Platelet   COMPLETE METABOLIC PANEL WITH GFR   Needs flu shot       Relevant Orders   Flu Vaccine QUAD 6+ mos PF IM (Fluarix Quad PF)   Screen for STD (sexually transmitted disease)       Relevant Orders   Urine cytology ancillary only   Cervicovaginal ancillary only   Hepatitis C antibody    HIV Antibody (routine testing w rflx)   RPR   Screening for endocrine, metabolic and immunity disorder       Relevant Orders   TSH   Lipid panel   COMPLETE METABOLIC PANEL WITH GFR   Screening, anemia, deficiency, iron       Relevant Orders   CBC with Differential/Platelet   Encounter for lipid screening for cardiovascular disease          Annual physical exam without new findings.  Well adult with acute concerns that will be addressed at subsequent visit.  Patient is concerned to have repeat STD screening today and is asymptomatic.  Oral and genital testing performed.  Plan: 1. Obtain health maintenance screenings as above according to age. - Increase physical activity to 30 minutes most days of the week.  - Eat healthy diet high in vegetables and fruits; low in refined carbohydrates. - Screening labs and tests as ordered, including for STD screening 2. Return 1 year for annual physical.   There are other unrelated non-urgent complaints (erectile dysfunction and hemorrhoids), but due to the busy schedule and the amount of time I've already spent with him, time does not permit me to address these routine issues at today's visit. I've requested another appointment to review these additional issues.  Follow up plan: Return for at pt's next convenience for erectile dysfunction and  hemorrhoids.  Wilhelmina Mcardle, DNP, AGPCNP-BC Adult Gerontology Primary Care Nurse Practitioner [facility] 01/15/2019, 9:06 AM

## 2019-01-16 LAB — LIPID PANEL
Cholesterol: 186 mg/dL (ref <200)
HDL: 50 mg/dL (ref >=40)
LDL Cholesterol (Calc): 110 mg/dL (calc) — ABNORMAL HIGH
Non-HDL Cholesterol (Calc): 136 mg/dL (calc) — ABNORMAL HIGH (ref <130)
Total CHOL/HDL Ratio: 3.7 (calc) (ref <5.0)
Triglycerides: 143 mg/dL (ref <150)

## 2019-01-16 LAB — COMPLETE METABOLIC PANEL WITH GFR
AG Ratio: 2.1 (calc) (ref 1.0–2.5)
ALT: 10 U/L (ref 9–46)
AST: 12 U/L (ref 10–40)
Albumin: 4.6 g/dL (ref 3.6–5.1)
Alkaline phosphatase (APISO): 49 U/L (ref 36–130)
BUN: 14 mg/dL (ref 7–25)
CO2: 27 mmol/L (ref 20–32)
Calcium: 9.7 mg/dL (ref 8.6–10.3)
Chloride: 105 mmol/L (ref 98–110)
Creat: 1.31 mg/dL (ref 0.60–1.35)
GFR, Est African American: 79 mL/min/{1.73_m2} (ref >=60)
GFR, Est Non African American: 68 mL/min/{1.73_m2} (ref >=60)
Globulin: 2.2 g/dL (calc) (ref 1.9–3.7)
Glucose, Bld: 102 mg/dL (ref 65–139)
Potassium: 4.3 mmol/L (ref 3.5–5.3)
Sodium: 140 mmol/L (ref 135–146)
Total Bilirubin: 0.7 mg/dL (ref 0.2–1.2)
Total Protein: 6.8 g/dL (ref 6.1–8.1)

## 2019-01-16 LAB — CBC WITH DIFFERENTIAL/PLATELET
Absolute Monocytes: 292 cells/uL (ref 200–950)
Basophils Absolute: 52 cells/uL (ref 0–200)
Basophils Relative: 1.4 %
Eosinophils Absolute: 81 cells/uL (ref 15–500)
Eosinophils Relative: 2.2 %
HCT: 43.2 % (ref 38.5–50.0)
Hemoglobin: 15.2 g/dL (ref 13.2–17.1)
Lymphs Abs: 851 cells/uL (ref 850–3900)
MCH: 31 pg (ref 27.0–33.0)
MCHC: 35.2 g/dL (ref 32.0–36.0)
MCV: 88.2 fL (ref 80.0–100.0)
MPV: 9.5 fL (ref 7.5–12.5)
Monocytes Relative: 7.9 %
Neutro Abs: 2424 cells/uL (ref 1500–7800)
Neutrophils Relative %: 65.5 %
Platelets: 285 10*3/uL (ref 140–400)
RBC: 4.9 10*6/uL (ref 4.20–5.80)
RDW: 13 % (ref 11.0–15.0)
Total Lymphocyte: 23 %
WBC: 3.7 10*3/uL — ABNORMAL LOW (ref 3.8–10.8)

## 2019-01-16 LAB — RPR: RPR Ser Ql: NONREACTIVE

## 2019-01-16 LAB — CERVICOVAGINAL ANCILLARY ONLY
Chlamydia: NEGATIVE
Neisseria Gonorrhea: NEGATIVE
Trichomonas: NEGATIVE

## 2019-01-16 LAB — URINE CYTOLOGY ANCILLARY ONLY
Chlamydia: NEGATIVE
Neisseria Gonorrhea: NEGATIVE

## 2019-01-16 LAB — HEPATITIS C ANTIBODY
Hepatitis C Ab: NONREACTIVE
SIGNAL TO CUT-OFF: 0.02 (ref <1.00)

## 2019-01-16 LAB — HIV ANTIBODY (ROUTINE TESTING W REFLEX): HIV 1&2 Ab, 4th Generation: NONREACTIVE

## 2019-05-09 ENCOUNTER — Other Ambulatory Visit: Payer: Self-pay | Admitting: Nurse Practitioner

## 2019-05-09 DIAGNOSIS — B009 Herpesviral infection, unspecified: Secondary | ICD-10-CM

## 2019-06-10 DIAGNOSIS — H44113 Panuveitis, bilateral: Secondary | ICD-10-CM | POA: Diagnosis not present

## 2019-06-10 DIAGNOSIS — Z114 Encounter for screening for human immunodeficiency virus [HIV]: Secondary | ICD-10-CM | POA: Diagnosis not present

## 2019-06-10 DIAGNOSIS — Z111 Encounter for screening for respiratory tuberculosis: Secondary | ICD-10-CM | POA: Diagnosis not present

## 2019-06-10 DIAGNOSIS — H44112 Panuveitis, left eye: Secondary | ICD-10-CM | POA: Diagnosis not present

## 2019-06-10 DIAGNOSIS — H30132 Disseminated chorioretinal inflammation, generalized, left eye: Secondary | ICD-10-CM | POA: Diagnosis not present

## 2019-06-11 DIAGNOSIS — H44112 Panuveitis, left eye: Secondary | ICD-10-CM | POA: Diagnosis not present

## 2019-06-11 DIAGNOSIS — H3589 Other specified retinal disorders: Secondary | ICD-10-CM | POA: Diagnosis not present

## 2019-06-18 DIAGNOSIS — H269 Unspecified cataract: Secondary | ICD-10-CM | POA: Diagnosis not present

## 2019-06-18 DIAGNOSIS — B001 Herpesviral vesicular dermatitis: Secondary | ICD-10-CM | POA: Diagnosis not present

## 2019-06-18 DIAGNOSIS — H30132 Disseminated chorioretinal inflammation, generalized, left eye: Secondary | ICD-10-CM | POA: Diagnosis not present

## 2019-06-25 DIAGNOSIS — H30132 Disseminated chorioretinal inflammation, generalized, left eye: Secondary | ICD-10-CM | POA: Diagnosis not present

## 2019-07-02 ENCOUNTER — Telehealth: Payer: Self-pay | Admitting: Nurse Practitioner

## 2019-07-02 NOTE — Telephone Encounter (Signed)
I called the patient to schedule AWV, but he said that he's busy right now. VDM (DD)

## 2019-07-16 DIAGNOSIS — H30132 Disseminated chorioretinal inflammation, generalized, left eye: Secondary | ICD-10-CM | POA: Diagnosis not present

## 2019-07-16 DIAGNOSIS — B0059 Other herpesviral disease of eye: Secondary | ICD-10-CM | POA: Diagnosis not present

## 2019-09-14 DIAGNOSIS — H3321 Serous retinal detachment, right eye: Secondary | ICD-10-CM | POA: Diagnosis not present

## 2019-09-14 DIAGNOSIS — H30132 Disseminated chorioretinal inflammation, generalized, left eye: Secondary | ICD-10-CM | POA: Diagnosis not present

## 2019-10-14 ENCOUNTER — Other Ambulatory Visit: Payer: Self-pay

## 2019-10-14 DIAGNOSIS — Z20822 Contact with and (suspected) exposure to covid-19: Secondary | ICD-10-CM

## 2019-10-14 DIAGNOSIS — Z20828 Contact with and (suspected) exposure to other viral communicable diseases: Secondary | ICD-10-CM | POA: Diagnosis not present

## 2019-10-16 LAB — NOVEL CORONAVIRUS, NAA: SARS-CoV-2, NAA: NOT DETECTED

## 2019-12-24 DIAGNOSIS — Z0100 Encounter for examination of eyes and vision without abnormal findings: Secondary | ICD-10-CM | POA: Diagnosis not present

## 2020-01-13 DIAGNOSIS — H6503 Acute serous otitis media, bilateral: Secondary | ICD-10-CM | POA: Diagnosis not present

## 2020-02-01 ENCOUNTER — Encounter: Payer: Medicare HMO | Admitting: Family Medicine

## 2020-02-05 ENCOUNTER — Ambulatory Visit (INDEPENDENT_AMBULATORY_CARE_PROVIDER_SITE_OTHER): Payer: Medicare HMO | Admitting: Family Medicine

## 2020-02-05 ENCOUNTER — Other Ambulatory Visit (HOSPITAL_COMMUNITY)
Admission: RE | Admit: 2020-02-05 | Discharge: 2020-02-05 | Disposition: A | Discharge status: Home / Self Care | Payer: Medicare HMO | Source: Ambulatory Visit | Attending: Family Medicine | Admitting: Family Medicine

## 2020-02-05 ENCOUNTER — Other Ambulatory Visit: Payer: Self-pay

## 2020-02-05 ENCOUNTER — Encounter: Payer: Self-pay | Admitting: Family Medicine

## 2020-02-05 VITALS — BP 137/81 | HR 91 | Temp 97.7°F | Resp 16 | Ht 72.0 in | Wt 190.2 lb

## 2020-02-05 DIAGNOSIS — Z7251 High risk heterosexual behavior: Secondary | ICD-10-CM | POA: Insufficient documentation

## 2020-02-05 DIAGNOSIS — E785 Hyperlipidemia, unspecified: Secondary | ICD-10-CM | POA: Diagnosis not present

## 2020-02-05 DIAGNOSIS — Z0000 Encounter for general adult medical examination without abnormal findings: Secondary | ICD-10-CM | POA: Insufficient documentation

## 2020-02-05 DIAGNOSIS — B009 Herpesviral infection, unspecified: Secondary | ICD-10-CM

## 2020-02-05 DIAGNOSIS — H6983 Other specified disorders of Eustachian tube, bilateral: Secondary | ICD-10-CM

## 2020-02-05 MED ORDER — METHYLPREDNISOLONE 4 MG PO TBPK: ORAL_TABLET | ORAL | 0 refills | Status: DC

## 2020-02-05 MED ORDER — VALACYCLOVIR HCL 500 MG PO TABS: 500.0000 mg | ORAL_TABLET | Freq: Two times a day (BID) | ORAL | 1 refills | Status: DC

## 2020-02-05 NOTE — Progress Notes (Signed)
Subjective:    Patient ID: Dakota Stanley, male    DOB: [birth year], 39 y.o.   MRN: [id number]  Dakota Stanley is a 39 y.o. male presenting on 02/05/2020 for Annual Exam   HPI  HEALTH MAINTENANCE:  Weight/BMI: Has gained 17lbs since last year, BMI 25.80, slightly over a healthy weight Physical activity: No structured exercise routine Diet: Regular Seatbelt: 100% of the time Sunscreen: 100% of the time Hepatitis & HIV Screening: Completed 01/15/2019, Repeat order today GC/CT: Ordered today Optometry: Goes regularly Dentistry: Goes regularly  IMMUNIZATIONS: Influenza: Recommended and declined Tetanus: 08/10/2015 COVID: Recommended when available  Depression screen [facility] 2/9 02/05/2020 01/15/2019 07/15/2018  Decreased Interest 0 0 0  Down, Depressed, Hopeless 0 0 0  PHQ - 2 Score 0 0 0    Past Medical History:  Diagnosis Date  . HSV-1 (herpes simplex virus 1) infection   . Inguinal hernia   . Legally blind   . Meningitis    Past Surgical History:  Procedure Laterality Date  . BACK SURGERY    . EYE SURGERY    . INGUINAL HERNIA REPAIR     Social History   Socioeconomic History  . Marital status: Single    Spouse name: Not on file  . Number of children: Not on file  . Years of education: Not on file  . Highest education level: Not on file  Occupational History  . Occupation: unemployed - disabled  Tobacco Use  . Smoking status: Current Some Day Smoker    Types: E-cigarettes  . Smokeless tobacco: Never Used  . Tobacco comment: Single cartridge over with THC  Substance and Sexual Activity  . Alcohol use: Yes    Comment: occassionally, social once per month 15 beverages over 8 hours  . Drug use: Yes    Types: Marijuana    Comment: vapor/oil with THC  . Sexual activity: Not Currently  Other Topics Concern  . Not on file  Social History Narrative  . Not on file   Social Determinants of Health   Financial Resource Strain:   . Difficulty of Paying Living  Expenses: Not on file  Food Insecurity:   . Worried About Charity fundraiser in the Last Year: Not on file  . Ran Out of Food in the Last Year: Not on file  Transportation Needs:   . Lack of Transportation (Medical): Not on file  . Lack of Transportation (Non-Medical): Not on file  Physical Activity:   . Days of Exercise per Week: Not on file  . Minutes of Exercise per Session: Not on file  Stress:   . Feeling of Stress : Not on file  Social Connections:   . Frequency of Communication with Friends and Family: Not on file  . Frequency of Social Gatherings with Friends and Family: Not on file  . Attends Religious Services: Not on file  . Active Member of Clubs or Organizations: Not on file  . Attends Archivist Meetings: Not on file  . Marital Status: Not on file  Intimate Partner Violence:   . Fear of Current or Ex-Partner: Not on file  . Emotionally Abused: Not on file  . Physically Abused: Not on file  . Sexually Abused: Not on file   Family History  Problem Relation Age of Onset  . Hypertension Mother   . Cerebral aneurysm Father   . Healthy Sister   . Healthy Brother   . Healthy Brother    Current Outpatient Medications  on File Prior to Visit  Medication Sig  . fluticasone (FLONASE) 50 MCG/ACT nasal spray   . Glycerin-Hypromellose-PEG 400 (ARTIFICIAL TEARS) 0.2-0.2-1 % SOLN Apply to eye.  . Multiple Vitamins-Minerals (MULTIVITAMIN ADULT PO) Take by mouth.   No current facility-administered medications on file prior to visit.    Per HPI unless specifically indicated above     Objective:    BP 137/81   Pulse 91   Temp 97.7 F (36.5 C) (Temporal)   Resp 16   Ht 6' (1.829 m)   Wt 190 lb 3.2 oz (86.3 kg)   BMI 25.80 kg/m   Wt Readings from Last 3 Encounters:  02/05/20 190 lb 3.2 oz (86.3 kg)  01/15/19 173 lb 12.8 oz (78.8 kg)  07/15/18 177 lb 6.4 oz (80.5 kg)    Physical Exam Vitals reviewed.  Constitutional:      General: He is not in acute  distress.    Appearance: Normal appearance. He is well-groomed and overweight. He is not ill-appearing or toxic-appearing.  HENT:     Head: Normocephalic.     Right Ear: Tympanic membrane, ear canal and external ear normal. There is no impacted cerumen.     Left Ear: Tympanic membrane, ear canal and external ear normal. There is no impacted cerumen.     Nose: Nose normal. No congestion or rhinorrhea.     Mouth/Throat:     Mouth: Mucous membranes are moist.     Pharynx: Oropharynx is clear. No oropharyngeal exudate or posterior oropharyngeal erythema.  Eyes:     General: Lids are normal. Vision grossly intact. No scleral icterus.       Right eye: No discharge or hordeolum.        Left eye: No discharge or hordeolum.     Extraocular Movements: Extraocular movements intact.     Conjunctiva/sclera: Conjunctivae normal.     Pupils: Pupils are equal, round, and reactive to light.     Comments: Right eye blindness.  Vision grossly intake to his baseline.  Neck:     Thyroid: No thyroid mass, thyromegaly or thyroid tenderness.  Cardiovascular:     Rate and Rhythm: Normal rate and regular rhythm.     Pulses: Normal pulses.          Dorsalis pedis pulses are 2+ on the right side and 2+ on the left side.       Posterior tibial pulses are 2+ on the right side and 2+ on the left side.     Heart sounds: Normal heart sounds. No murmur. No friction rub. No gallop.   Pulmonary:     Effort: Pulmonary effort is normal. No respiratory distress.     Breath sounds: Normal breath sounds.  Abdominal:     General: Abdomen is flat. Bowel sounds are normal. There is no distension or abdominal bruit.     Palpations: Abdomen is soft. There is no hepatomegaly, splenomegaly or mass.     Tenderness: There is no abdominal tenderness. There is no guarding or rebound.     Hernia: No hernia is present.  Musculoskeletal:        General: Normal range of motion.     Cervical back: Normal range of motion and neck supple.  No tenderness.     Right lower leg: No edema.     Left lower leg: No edema.  Feet:     Right foot:     Skin integrity: Skin integrity normal.     Left foot:  Skin integrity: Skin integrity normal.  Lymphadenopathy:     Cervical: No cervical adenopathy.  Skin:    General: Skin is warm and dry.     Capillary Refill: Capillary refill takes less than 2 seconds.  Neurological:     General: No focal deficit present.     Mental Status: He is alert and oriented to person, place, and time.     Cranial Nerves: Cranial nerves are intact.     Sensory: Sensation is intact.     Motor: Motor function is intact.     Coordination: Coordination is intact.     Gait: Gait is intact.     Deep Tendon Reflexes: Reflexes normal.  Psychiatric:        Attention and Perception: Attention and perception normal.        Mood and Affect: Mood and affect normal.        Speech: Speech normal.        Behavior: Behavior normal. Behavior is cooperative.        Thought Content: Thought content normal.        Cognition and Memory: Cognition and memory normal.        Judgment: Judgment normal.     Results for orders placed or performed in visit on 10/14/19  Novel Coronavirus, NAA ([facility])   Specimen: Nasopharyngeal(NP) swabs in vial transport medium   NASOPHARYNGE  TESTING  Result Value Ref Range   SARS-CoV-2, NAA Not Detected Not Detected      Assessment & Plan:   Problem List Items Addressed This Visit      Other   HSV-1 (herpes simplex virus 1) infection   Relevant Medications   valACYclovir (VALTREX) 500 MG tablet   Routine medical exam - Primary    Doing well over the past year, has gained approx 17lbs but still only at a 25.80 BMI.  Has acute concerns for STI screenings as he had 11 sexual partners last year.  Denies any abdominal pain, n/v/d, penile discharge/drainage, pain with urination, fevers.  Has had some bilateral ear pain that he was seen by an urgent care over the past week and was  told to begin using flonase to help decrease the eustachian tube dysfunction, no relief. Will add a Medrol Dose Pack taper to his treatment plan to help reduce the fluid in his ears.  Recommended and will be looking into the COVID vaccine once it becomes available to his age group.  Labs ordered and drawn today, urine sent to the lab for testing and will contact once results are received.  We will see you back in clinic in 1 year for your next physical, or sooner should the need arise.      Relevant Orders   CBC with Differential   COMPLETE METABOLIC PANEL WITH GFR   Dyslipidemia   Relevant Orders   Lipid Profile    Other Visit Diagnoses    High risk heterosexual behavior       Relevant Orders   HIV antibody (with reflex)   Hepatitis C Antibody   GC/Chlamydia probe amp ([place])not at [facility]   Dysfunction of both eustachian tubes       Relevant Medications   methylPREDNISolone (MEDROL DOSEPAK) 4 MG TBPK tablet      Meds ordered this encounter  Medications  . valACYclovir (VALTREX) 500 MG tablet    Sig: Take 1 tablet (500 mg total) by mouth 2 (two) times daily.    Dispense:  180 tablet  Refill:  1  . methylPREDNISolone (MEDROL DOSEPAK) 4 MG TBPK tablet    Sig: Take according to package insert directions    Dispense:  1 each    Refill:  0      Follow up plan: Return in about 1 year (around 02/04/2021) for Annual PE.  Charlaine Dalton, FNP-C Family Nurse Practitioner [facility] 02/05/2020, 11:14 AM

## 2020-02-05 NOTE — Assessment & Plan Note (Signed)
Doing well over the past year, has gained approx 17lbs but still only at a 25.80 BMI.  Has acute concerns for STI screenings as he had 11 sexual partners last year.  Denies any abdominal pain, n/v/d, penile discharge/drainage, pain with urination, fevers.  Has had some bilateral ear pain that he was seen by an urgent care over the past week and was told to begin using flonase to help decrease the eustachian tube dysfunction, no relief. Will add a Medrol Dose Pack taper to his treatment plan to help reduce the fluid in his ears.  Recommended and will be looking into the COVID vaccine once it becomes available to his age group.  Labs ordered and drawn today, urine sent to the lab for testing and will contact once results are received.  We will see you back in clinic in 1 year for your next physical, or sooner should the need arise.

## 2020-02-05 NOTE — Patient Instructions (Addendum)
We have sent your urine and blood work to the lab for processing, we will contact you once the results are received.  Take the Medrol Dose Pak as directed for your eustachian tube dysfunction.  Practice safer sexual practices, including male and/or male condom use.  Having high HDL (good) cholesterol and low triglyceride levels can reduce your risk of heart attack and stroke.  The following steps can be taken to reduce triglyceride levels and increase HDL (good) cholesterol levels.  Avoid or limit alcohol Alcohol significantly raises triglyceride levels.  If your triglycerides are higher than 150, avoid or limit alcohol.  Limit fruit juice and dried fruits Even fresh fruit juice contains a large amount of fructose sugar and can increase triglyceride and blood sugar levels.  Fruit juice and dried fruit are also high in calories and can add unwanted pounds.  Replace fruit juice and dried fruits with fresh fruit that has more fiber and fewer calories.  Limit non-diet soft drinks and sports drinks Non-diet soft drinks and sport drinks contain as many as 12 teaspoons of sugar per serving.  This sugar can increase triglyceride and blood sugar levels.  Non-diet soft drinks and sport drinks also contain calories that add unwanted pounds.  Replace non-diet soft drinks and sports drinks with water, unsweetened tea, diet colas or beverages sweetened with Nutra-Sweet.  Limit concentrated sweets Even fat free or low fat sweets can raise your triglyceride and/or blood sugar levels.  Concentrated sweets include sugar, honey, jelly, candy, cookies, cakes, pies, pastries, frozen desserts, puddings, and sugar sweetened cereals.  Replace concentrated sweets with high fiber foods such as fruit, low fat yogurt, sugar free gelatin and low fat puddings.  Limit refined carbohydrates Refined carbohydrates include white flour, white bread, white rice, and some pasta.  Limit portion sizes of these foods or replace  them with whole wheat bread, lentils, whole grains, brown rice, and spinach or whole-wheat pastas.  Reduce your weight, if needed Even a 5-10 pound weight loss can cause your triglyceride level to decrease significantly.  You can lose 1-2 pounds per week by limiting your portion sizes and exercising 5-6 times per week.  Include monounsaturated fats in your diet.  Include Monounsaturated fats in your diet Moderate amounts of monounsaturated fat can raise your HDL (good) cholesterol.  Sources of monounsaturated fat include olive oil, canola oil, peanut oil, peanuts, peanut butter, cashews, olives, and avocados.  Choose peanut butter that does not have sugar in the ingredient list.  Since these foods are high in calories, serving sizes should be moderate.  Include fish in your diet Fish is low in saturated fat and rich in Omega-3 fatty acids.  Good choices include mackerel, salmon, herring, bluefish, lake trout, tuna, and sardines canned in oil.  If you smoke, quit Smoking lowers HDL (good) cholesterol and raises triglycerides.  When you quit smoking your HDL (good) cholesterol increases and triglycerides decrease.  Stay active Exercise raises your HDL (good) cholesterol.  Walk, ride a bike, or swim for at least 20 minutes, 3-5 times per week.  Ideally, you should try to exercise for 30-60 minutes most days of the week.  Check with your Healthcare Provider before beginning an exercise program.  We will plan to see you back in 12 months for physical exam  You will receive a survey after today's visit either digitally by e-mail or paper by [place] mail. Your experiences and feedback matter to [place].  Please respond so we know how we are doing as  we provide care for you.  Call us with any questions/concerns/needs.  It is my goal to be available to you for your health concerns.  Thanks for choosing me to be a partner in your healthcare needs!  Charlaine Dalton, FNP-C Family Nurse Practitioner [facility] Phone: [phone]

## 2020-02-08 ENCOUNTER — Other Ambulatory Visit: Payer: Self-pay | Admitting: Family Medicine

## 2020-02-08 DIAGNOSIS — Z20828 Contact with and (suspected) exposure to other viral communicable diseases: Secondary | ICD-10-CM | POA: Diagnosis not present

## 2020-02-08 LAB — CBC WITH DIFFERENTIAL/PLATELET
Absolute Monocytes: 426 cells/uL (ref 200–950)
Basophils Absolute: 42 cells/uL (ref 0–200)
Basophils Relative: 0.8 %
Eosinophils Absolute: 52 cells/uL (ref 15–500)
Eosinophils Relative: 1 %
HCT: 44 % (ref 38.5–50.0)
Hemoglobin: 15.4 g/dL (ref 13.2–17.1)
Lymphs Abs: 952 cells/uL (ref 850–3900)
MCH: 30.2 pg (ref 27.0–33.0)
MCHC: 35 g/dL (ref 32.0–36.0)
MCV: 86.3 fL (ref 80.0–100.0)
MPV: 9.5 fL (ref 7.5–12.5)
Monocytes Relative: 8.2 %
Neutro Abs: 3728 cells/uL (ref 1500–7800)
Neutrophils Relative %: 71.7 %
Platelets: 280 10*3/uL (ref 140–400)
RBC: 5.1 10*6/uL (ref 4.20–5.80)
RDW: 12.9 % (ref 11.0–15.0)
Total Lymphocyte: 18.3 %
WBC: 5.2 10*3/uL (ref 3.8–10.8)

## 2020-02-08 LAB — LIPID PANEL
Cholesterol: 210 mg/dL — ABNORMAL HIGH (ref <200)
HDL: 46 mg/dL (ref >=40)
LDL Cholesterol (Calc): 136 mg/dL (calc) — ABNORMAL HIGH
Non-HDL Cholesterol (Calc): 164 mg/dL (calc) — ABNORMAL HIGH (ref <130)
Total CHOL/HDL Ratio: 4.6 (calc) (ref <5.0)
Triglycerides: 150 mg/dL — ABNORMAL HIGH (ref <150)

## 2020-02-08 LAB — COMPLETE METABOLIC PANEL WITH GFR
AG Ratio: 1.9 (calc) (ref 1.0–2.5)
ALT: 14 U/L (ref 9–46)
AST: 16 U/L (ref 10–40)
Albumin: 4.9 g/dL (ref 3.6–5.1)
Alkaline phosphatase (APISO): 54 U/L (ref 36–130)
BUN: 15 mg/dL (ref 7–25)
CO2: 27 mmol/L (ref 20–32)
Calcium: 10.5 mg/dL — ABNORMAL HIGH (ref 8.6–10.3)
Chloride: 101 mmol/L (ref 98–110)
Creat: 1.15 mg/dL (ref 0.60–1.35)
GFR, Est African American: 92 mL/min/{1.73_m2} (ref >=60)
GFR, Est Non African American: 79 mL/min/{1.73_m2} (ref >=60)
Globulin: 2.6 g/dL (calc) (ref 1.9–3.7)
Glucose, Bld: 110 mg/dL (ref 65–139)
Potassium: 4 mmol/L (ref 3.5–5.3)
Sodium: 137 mmol/L (ref 135–146)
Total Bilirubin: 0.8 mg/dL (ref 0.2–1.2)
Total Protein: 7.5 g/dL (ref 6.1–8.1)

## 2020-02-08 LAB — HEPATITIS C ANTIBODY
Hepatitis C Ab: NONREACTIVE
SIGNAL TO CUT-OFF: 0.01 (ref <1.00)

## 2020-02-08 LAB — GC/CHLAMYDIA PROBE AMP (~~LOC~~) NOT AT ARMC
Chlamydia: NEGATIVE
Comment: NEGATIVE
Comment: NORMAL
Neisseria Gonorrhea: NEGATIVE

## 2020-02-08 LAB — HIV ANTIBODY (ROUTINE TESTING W REFLEX): HIV 1&2 Ab, 4th Generation: NONREACTIVE

## 2020-02-08 NOTE — Progress Notes (Signed)
His cholesterol is elevated, would recommend diet and exercise and we can plan to repeat these levels in 6 months before we begin any medications for reducing cholesterol.  He has an elevated calcium level, which could be his "normal".  I am going to add on a few additional labs to see if this is his normal levels or if there is anything abnormal.  If he can have these drawn in the next 1-2 weeks and we can call with his results.  His sexually transmitted diseases levels were negative.

## 2020-02-11 IMAGING — DX DG FOOT COMPLETE 3+V*L*
3 series · 3 of 3 positions shown · non-contrast
Comparison: None.

CLINICAL DATA: Left fourth toe pain after recent fall

EXAM:
LEFT FOOT - COMPLETE 3+ VIEW

[foot ap]
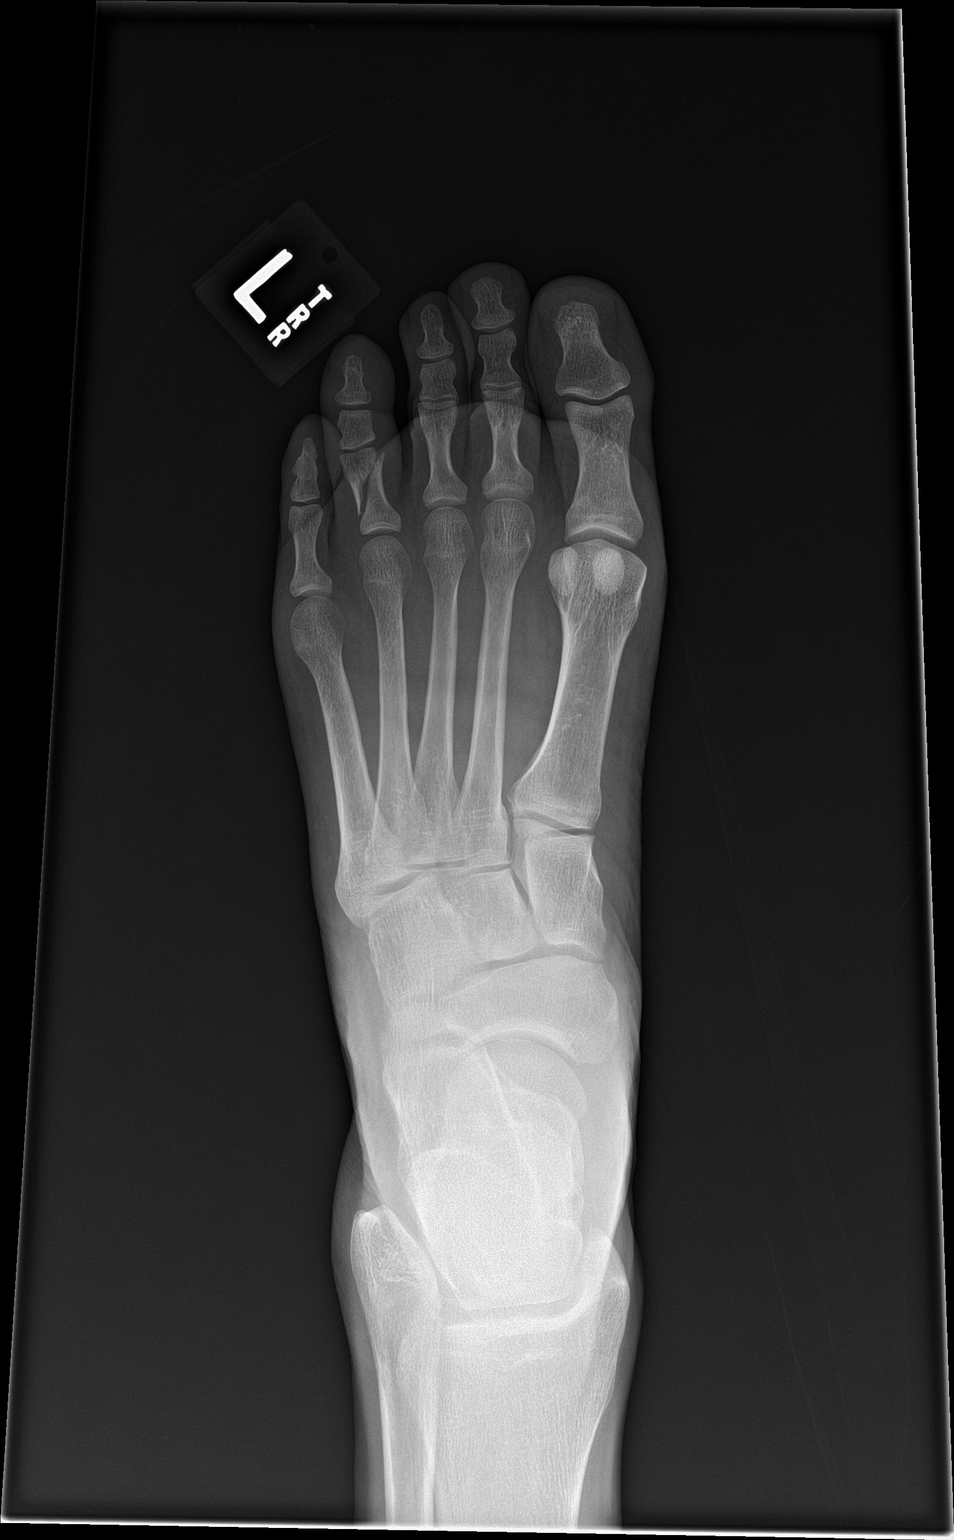

[foot obl]
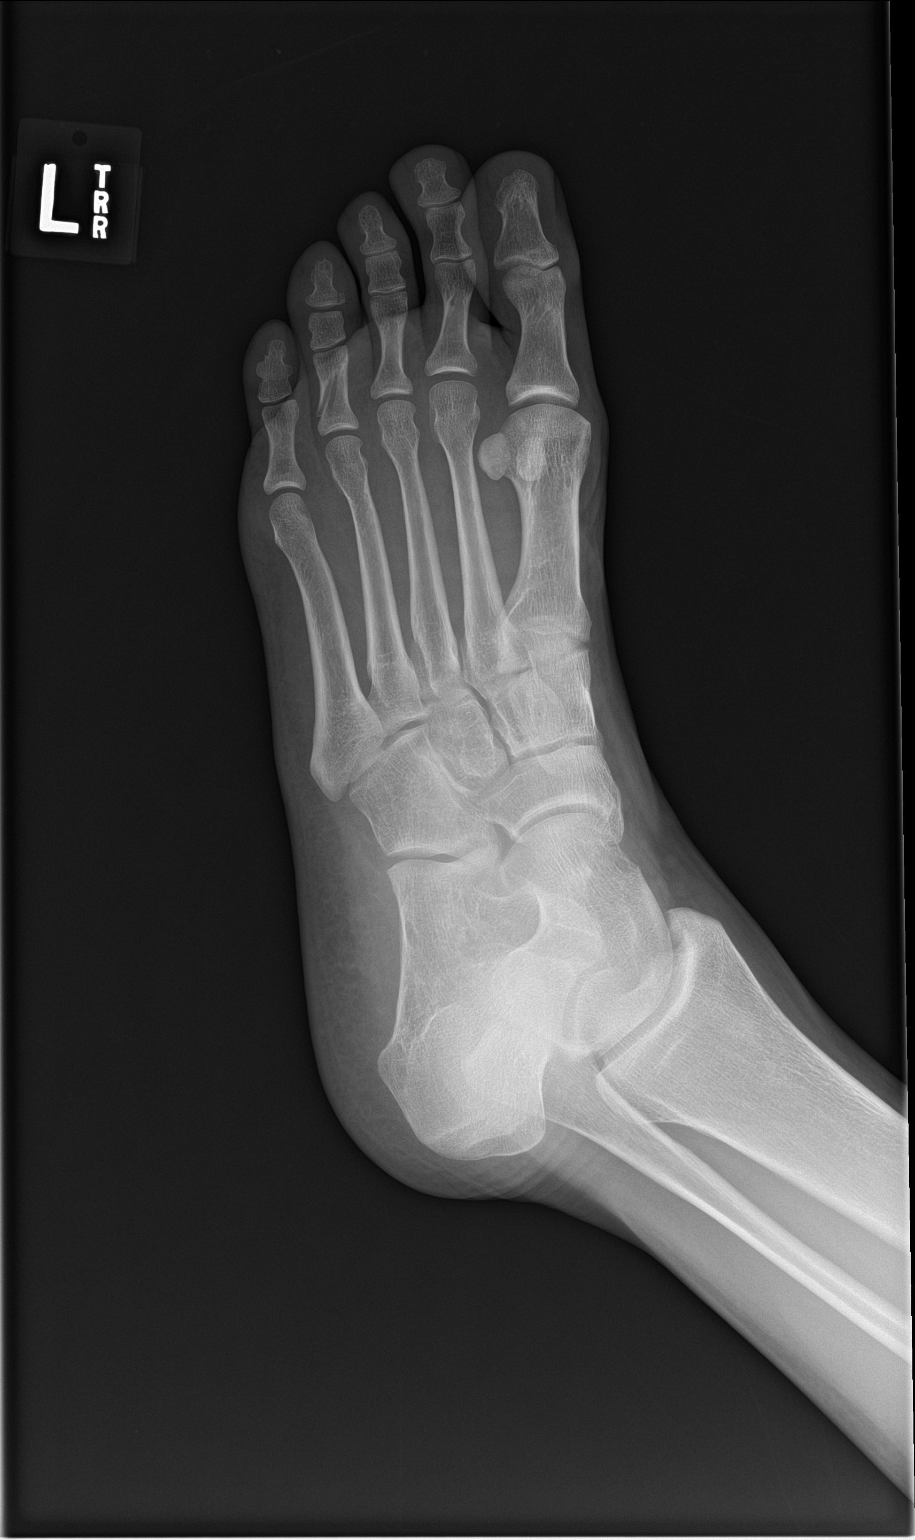

[foot lat]
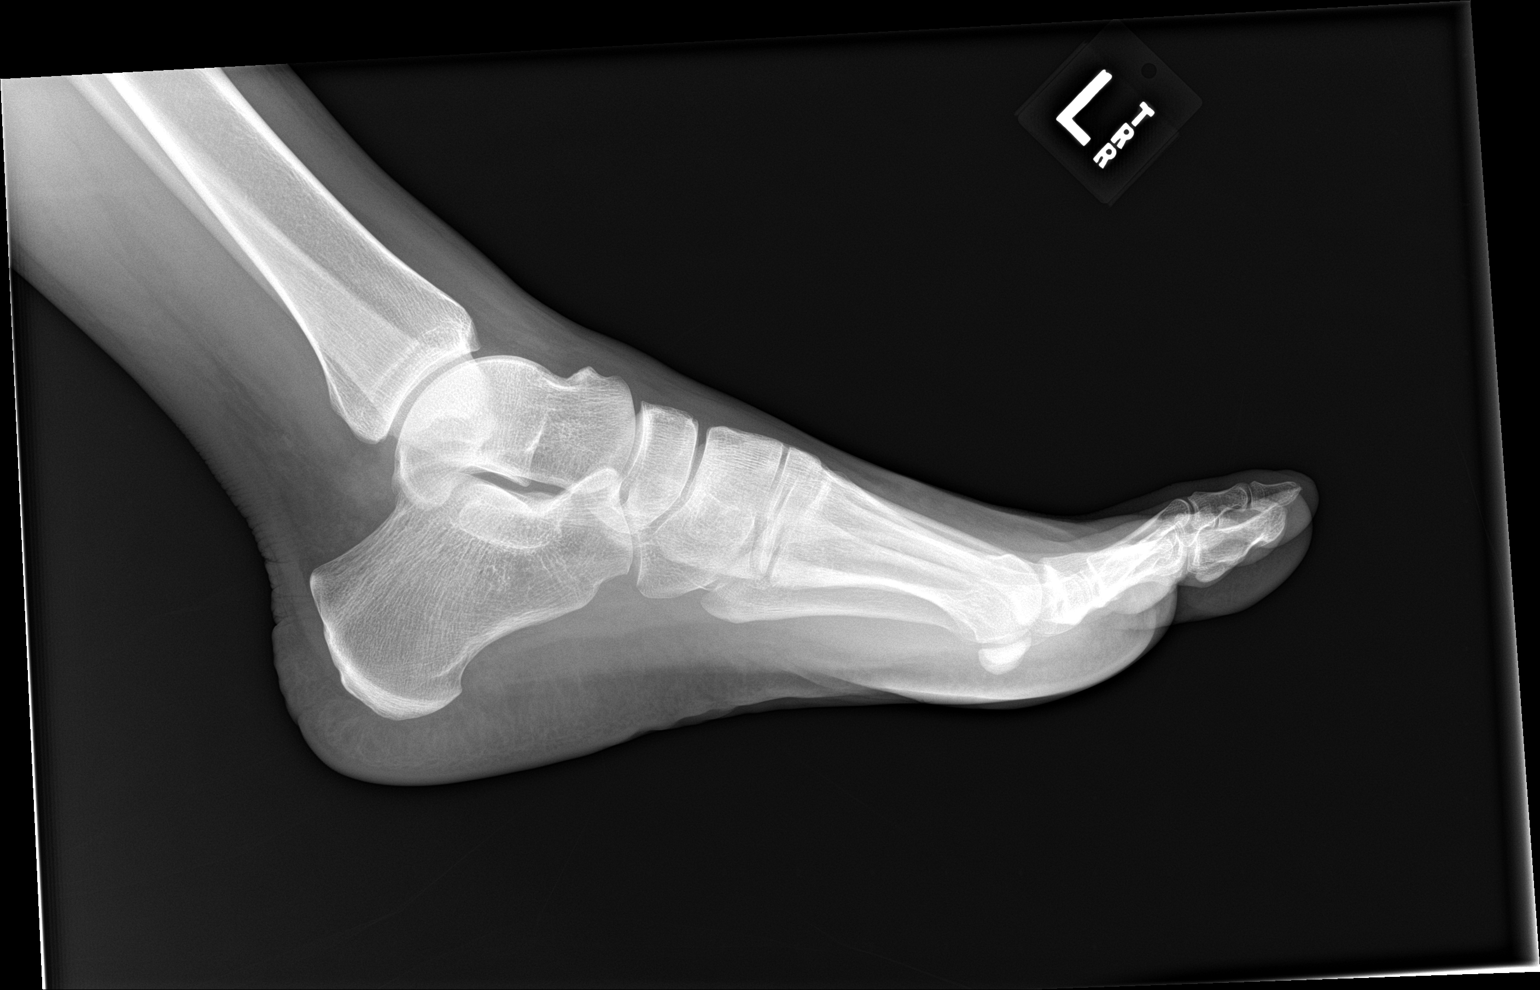

[3 of 3 positions shown; findings below may reference images not displayed]

FINDINGS: Oblique non articular proximal phalanx fracture in the left fourth
toe with surrounding soft tissue swelling and with 2 mm lateral
displacement of the distal fracture fragment. No additional
fracture. No dislocation. No suspicious focal osseous lesion. No
significant arthropathy. No radiopaque foreign body.
IMPRESSION: Oblique mildly displaced non articular proximal phalanx left fourth
toe fracture.

## 2020-02-24 ENCOUNTER — Other Ambulatory Visit: Payer: Medicare HMO

## 2020-02-24 ENCOUNTER — Other Ambulatory Visit: Payer: Self-pay

## 2020-02-25 ENCOUNTER — Encounter: Payer: Self-pay | Admitting: Family Medicine

## 2020-02-29 NOTE — Progress Notes (Signed)
Awaiting PTHRP results

## 2020-03-02 NOTE — Progress Notes (Signed)
Calcium levels went back to normal on repeat CMP.  All the rest of his labs are WNL (awaiting PTHRP - but anticipate returning WNL), except his Vitamin D is low.  I can send in a supplement to take 1x a week for the next 12 weeks if he would like.  Thanks

## 2020-03-03 ENCOUNTER — Other Ambulatory Visit: Payer: Self-pay | Admitting: Family Medicine

## 2020-03-03 DIAGNOSIS — E559 Vitamin D deficiency, unspecified: Secondary | ICD-10-CM

## 2020-03-03 MED ORDER — VITAMIN D (ERGOCALCIFEROL) 1.25 MG (50000 UNIT) PO CAPS: 50000.0000 [IU] | ORAL_CAPSULE | ORAL | 0 refills | Status: AC

## 2020-03-05 LAB — VITAMIN D 25 HYDROXY (VIT D DEFICIENCY, FRACTURES): Vit D, 25-Hydroxy: 18 ng/mL — ABNORMAL LOW (ref 30–100)

## 2020-03-05 LAB — COMPLETE METABOLIC PANEL WITH GFR
AG Ratio: 2 (calc) (ref 1.0–2.5)
ALT: 17 U/L (ref 9–46)
AST: 14 U/L (ref 10–40)
Albumin: 4.9 g/dL (ref 3.6–5.1)
Alkaline phosphatase (APISO): 59 U/L (ref 36–130)
BUN: 17 mg/dL (ref 7–25)
CO2: 28 mmol/L (ref 20–32)
Calcium: 10.1 mg/dL (ref 8.6–10.3)
Chloride: 102 mmol/L (ref 98–110)
Creat: 1.28 mg/dL (ref 0.60–1.35)
GFR, Est African American: 81 mL/min/{1.73_m2} (ref >=60)
GFR, Est Non African American: 70 mL/min/{1.73_m2} (ref >=60)
Globulin: 2.5 g/dL (calc) (ref 1.9–3.7)
Glucose, Bld: 143 mg/dL — ABNORMAL HIGH (ref 65–99)
Potassium: 3.8 mmol/L (ref 3.5–5.3)
Sodium: 138 mmol/L (ref 135–146)
Total Bilirubin: 0.8 mg/dL (ref 0.2–1.2)
Total Protein: 7.4 g/dL (ref 6.1–8.1)

## 2020-03-05 LAB — VITAMIN D 1,25 DIHYDROXY
Vitamin D 1, 25 (OH)2 Total: 28 pg/mL (ref 18–72)
Vitamin D2 1, 25 (OH)2: <8 pg/mL
Vitamin D3 1, 25 (OH)2: 28 pg/mL

## 2020-03-05 LAB — PTH-RELATED PEPTIDE: PTH-Related Protein (PTH-RP): 15 pg/mL (ref 14–27)

## 2020-03-05 LAB — PARATHYROID HORMONE, INTACT (NO CA): PTH: 15 pg/mL (ref 14–64)

## 2020-06-27 DIAGNOSIS — R42 Dizziness and giddiness: Secondary | ICD-10-CM | POA: Diagnosis not present

## 2020-06-29 DIAGNOSIS — R002 Palpitations: Secondary | ICD-10-CM | POA: Diagnosis not present

## 2020-07-12 DIAGNOSIS — H3321 Serous retinal detachment, right eye: Secondary | ICD-10-CM | POA: Diagnosis not present

## 2020-12-29 DIAGNOSIS — Z20822 Contact with and (suspected) exposure to covid-19: Secondary | ICD-10-CM | POA: Diagnosis not present

## 2020-12-29 DIAGNOSIS — Z03818 Encounter for observation for suspected exposure to other biological agents ruled out: Secondary | ICD-10-CM | POA: Diagnosis not present

## 2021-01-02 ENCOUNTER — Encounter: Payer: Self-pay | Admitting: Internal Medicine

## 2021-01-02 ENCOUNTER — Ambulatory Visit (INDEPENDENT_AMBULATORY_CARE_PROVIDER_SITE_OTHER): Payer: Medicare HMO | Admitting: Internal Medicine

## 2021-01-02 ENCOUNTER — Other Ambulatory Visit: Payer: Self-pay

## 2021-01-02 DIAGNOSIS — H548 Legal blindness, as defined in USA: Secondary | ICD-10-CM | POA: Diagnosis not present

## 2021-01-02 DIAGNOSIS — B009 Herpesviral infection, unspecified: Secondary | ICD-10-CM | POA: Diagnosis not present

## 2021-01-02 NOTE — Progress Notes (Signed)
HPI  Pt presents to the clinic today to establish care and for management of the conditions listed below.  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 Uses eye drops daily. Follows with ophthalmology yearly.  Flu: 11/2019 Tetanus: 08/2015 Covid: Pfizer x 2 Vision Screening: annually Dentist: annually   Past Medical History:  Diagnosis Date  . HSV-1 (herpes simplex virus 1) infection   . Inguinal hernia   . Legally blind   . Meningitis     Current Outpatient Medications  Medication Sig Dispense Refill  . Glycerin-Hypromellose-PEG 400 0.2-0.2-1 % SOLN Apply to eye.    . valACYclovir (VALTREX) 500 MG tablet Take 1 tablet (500 mg total) by mouth 2 (two) times daily. 180 tablet 1   No current facility-administered medications for this visit.    Allergies  Allergen Reactions  . Shellfish Allergy Other (See Comments), Shortness Of Breath and Swelling    Tongue swelling  Other reaction(s): Angioedema / Facial Swelling Swelling tongue   . Morphine And Related Hives    Family History  Problem Relation Age of Onset  . Hypertension Mother   . Cerebral aneurysm Father   . Healthy Sister   . Healthy Brother   . Healthy Brother     Social History   Socioeconomic History  . Marital status: Single    Spouse name: Not on file  . Number of children: Not on file  . Years of education: Not on file  . Highest education level: Not on file  Occupational History  . Occupation: unemployed - disabled  Tobacco Use  . Smoking status: Current Some Day Smoker    Types: E-cigarettes  . Smokeless tobacco: Never Used  . Tobacco comment: Single cartridge over with THC  Vaping Use  . Vaping Use: Some days  Substance and Sexual Activity  . Alcohol use: Yes    Comment: occassionally, social once per month 15 beverages over 8 hours  . Drug use: Yes    Types: Marijuana    Comment: vapor/oil with THC  . Sexual activity: Not  Currently  Other Topics Concern  . Not on file  Social History Narrative  . Not on file   Social Determinants of Health   Financial Resource Strain: Not on file  Food Insecurity: Not on file  Transportation Needs: Not on file  Physical Activity: Not on file  Stress: Not on file  Social Connections: Not on file  Intimate Partner Violence: Not on file    ROS:  Constitutional: Denies fever, malaise, fatigue, headache or abrupt weight changes.  HEENT: Denies eye pain, eye redness, ear pain, ringing in the ears, wax buildup, runny nose, nasal congestion, bloody nose, or sore throat. Respiratory: Denies difficulty breathing, shortness of breath, cough or sputum production.   Cardiovascular: Denies chest pain, chest tightness, palpitations or swelling in the hands or feet.  Gastrointestinal: Denies abdominal pain, bloating, constipation, diarrhea or blood in the stool.  GU: Denies frequency, urgency, pain with urination, blood in urine, odor or discharge. Musculoskeletal: Denies decrease in range of motion, difficulty with gait, muscle pain or joint pain and swelling.  Skin: Denies redness, rashes, lesions or ulcercations.  Neurological: Denies dizziness, difficulty with memory, difficulty with speech or problems with balance and coordination.  Psych: Denies anxiety, depression, SI/HI.  No other specific complaints in a complete review of systems (except as listed in HPI above).  PE:  BP 122/76   Pulse 64  Temp (!) 97.1 F (36.2 C) (Temporal)   Wt 180 lb (81.6 kg)   SpO2 97%   BMI 24.41 kg/m  Wt Readings from Last 3 Encounters:  01/02/21 180 lb (81.6 kg)  02/05/20 190 lb 3.2 oz (86.3 kg)  01/15/19 173 lb 12.8 oz (78.8 kg)    General: Appears his stated age, well developed, well nourished in NAD. Cardiovascular: Normal rate Pulmonary/Chest: Normal effort. Musculoskeletal: No difficulty with gait.  Neurological: Alert and oriented.  Psychiatric: Mood and affect normal.  Behavior is normal. Judgment and thought content normal.     BMET    Component Value Date/Time   NA 138 02/24/2020 1003   K 3.8 02/24/2020 1003   CL 102 02/24/2020 1003   CO2 28 02/24/2020 1003   GLUCOSE 143 (H) 02/24/2020 1003   BUN 17 02/24/2020 1003   CREATININE 1.28 02/24/2020 1003   CALCIUM 10.1 02/24/2020 1003   GFRNONAA 70 02/24/2020 1003   GFRAA 81 02/24/2020 1003    Lipid Panel     Component Value Date/Time   CHOL 210 (H) 02/05/2020 1121   TRIG 150 (H) 02/05/2020 1121   HDL 46 02/05/2020 1121   CHOLHDL 4.6 02/05/2020 1121   LDLCALC 136 (H) 02/05/2020 1121    CBC    Component Value Date/Time   WBC 5.2 02/05/2020 1121   RBC 5.10 02/05/2020 1121   HGB 15.4 02/05/2020 1121   HCT 44.0 02/05/2020 1121   PLT 280 02/05/2020 1121   MCV 86.3 02/05/2020 1121   MCH 30.2 02/05/2020 1121   MCHC 35.0 02/05/2020 1121   RDW 12.9 02/05/2020 1121   LYMPHSABS 952 02/05/2020 1121   EOSABS 52 02/05/2020 1121   BASOSABS 42 02/05/2020 1121    Hgb A1C No results found for: HGBA1C   Assessment and Plan:

## 2021-01-02 NOTE — Patient Instructions (Signed)
Cold Sore  A cold sore, also called a fever blister, is a small, fluid-filled sore that forms inside of the mouth or on the lips, gums, nose, chin, or cheeks. Cold sores can spread to other parts of the body, such as the eyes or fingers. Cold sores can spread from person to person (are contagious) until the sores crust over completely. Most cold sores go away within 2 weeks. What are the causes? Cold sores are caused by a virus (herpes simplex virus type 1, HSV-1). The virus can spread from person to person through close contact, such as through:  Kissing.  Touching the affected area.  Sharing personal items such as lip balm, razors, a drinking glass, or eating utensils. What increases the risk? You are more likely to develop this condition if you:  Are tired, stressed, or sick.  Are having your period (menstruating).  Are pregnant.  Take certain medicines.  Are out in cold weather or get too much sun. What are the signs or symptoms? Symptoms of a cold sore outbreak go through different stages. These are the stages of a cold sore:  Tingling, itching, or burning is felt 1-2 days before the outbreak.  Fluid-filled blisters appear on the lips, inside the mouth, on the nose, or on the cheeks.  The blisters start to ooze clear fluid.  The blisters dry up, and a yellow crust appears in their place.  The crust falls off. In some cases, other symptoms can develop during a cold sore outbreak. These can include:  Fever.  Sore throat.  Headache.  Muscle aches.  Swollen neck glands. How is this treated? There is no cure for cold sores or the virus that causes them. There is also no vaccine to prevent the virus. Most cold sores go away on their own without treatment within 2 weeks. Your doctor may prescribe medicines to:  Help with pain.  Keep the virus from growing.  Help you heal faster. Medicines may be in the form of creams, gels, pills, or a shot. Follow these  instructions at home: Medicines  Take or apply over-the-counter and prescription medicines only as told by your doctor.  Use a cotton-tip swab to apply creams or gels to your sores.  Ask your doctor if you can take lysine supplements. These may help with healing. Sore care  Do not touch the sores or pick the scabs.  Wash your hands often. Do not touch your eyes without washing your hands first.  Keep the sores clean and dry.  If told, put ice on the sores: ? Put ice in a plastic bag. ? Place a towel between your skin and the bag. ? Leave the ice on for 20 minutes, 2-3 times a day.   Eating and drinking  Eat a soft, bland diet. Avoid eating hot, cold, or salty foods. These can hurt your mouth.  Use a straw if it hurts to drink out of a glass.  Eat foods that have a lot of lysine in them. These include meat, fish, and dairy products.  Avoid sugary foods, chocolates, nuts, and grains. These foods have a high amount of a substance (arginine) that can cause the virus to grow. Lifestyle  Do not kiss, have oral sex, or share personal items until your sores heal.  Stress, poor sleep, and being out in the sun can trigger outbreaks. Make sure you: ? Do activities that help you relax, such as deep breathing exercises or meditation. ? Get enough sleep. ? Apply   sunscreen on your lips before you go out in the sun. Contact a doctor if:  You have symptoms for more than 2 weeks.  You have pus coming from the sores.  You have redness that is spreading.  You have pain or irritation in your eye.  You get sores on your genitals.  Your sores do not heal within 2 weeks.  You get cold sores often. Get help right away if:  You have a fever and your symptoms suddenly get worse.  You have a headache and confusion.  You have tiredness (fatigue).  You do not want to eat as much as normal (loss of appetite).  You have a stiff neck or are sensitive to light. Summary  A cold sore is  a small, fluid-filled sore that forms inside of the mouth or on the lips, gums, nose, chin, or cheeks.  Cold sores can spread from person to person (are contagious) until the sores crust over completely. Most cold sores go away within 2 weeks.  Wash your hands often. Do not touch your eyes without washing your hands first.  Do not kiss, have oral sex, or share personal items until your sores heal.  Contact a doctor if your sores do not heal within 2 weeks. This information is not intended to replace advice given to you by your health care provider. Make sure you discuss any questions you have with your health care provider. Document Revised: 03/11/2019 Document Reviewed: 04/21/2018 Elsevier Patient Education  2021 [employer]  

## 2021-01-02 NOTE — Assessment & Plan Note (Signed)
Using eye drops Follows with opthalmology

## 2021-01-02 NOTE — Assessment & Plan Note (Signed)
Managed with suppressive Valtrex Will monitor

## 2021-01-12 DIAGNOSIS — H52222 Regular astigmatism, left eye: Secondary | ICD-10-CM | POA: Diagnosis not present

## 2021-01-12 DIAGNOSIS — Z0100 Encounter for examination of eyes and vision without abnormal findings: Secondary | ICD-10-CM | POA: Diagnosis not present

## 2021-03-02 ENCOUNTER — Other Ambulatory Visit: Payer: Self-pay

## 2021-03-02 ENCOUNTER — Encounter: Payer: Self-pay | Admitting: Internal Medicine

## 2021-03-02 ENCOUNTER — Ambulatory Visit (INDEPENDENT_AMBULATORY_CARE_PROVIDER_SITE_OTHER): Payer: Medicare HMO | Admitting: Internal Medicine

## 2021-03-02 VITALS — BP 122/84 | HR 98 | Temp 97.0°F | Ht 71.0 in | Wt 176.0 lb

## 2021-03-02 DIAGNOSIS — Z0000 Encounter for general adult medical examination without abnormal findings: Secondary | ICD-10-CM | POA: Diagnosis not present

## 2021-03-02 DIAGNOSIS — Z113 Encounter for screening for infections with a predominantly sexual mode of transmission: Secondary | ICD-10-CM

## 2021-03-02 DIAGNOSIS — H548 Legal blindness, as defined in USA: Secondary | ICD-10-CM | POA: Diagnosis not present

## 2021-03-02 LAB — COMPREHENSIVE METABOLIC PANEL WITH GFR
ALT: 11 U/L (ref 0–53)
AST: 13 U/L (ref 0–37)
Albumin: 4.7 g/dL (ref 3.5–5.2)
Alkaline Phosphatase: 49 U/L (ref 39–117)
BUN: 17 mg/dL (ref 6–23)
CO2: 27 meq/L (ref 19–32)
Calcium: 10 mg/dL (ref 8.4–10.5)
Chloride: 102 meq/L (ref 96–112)
Creatinine, Ser: 1.28 mg/dL (ref 0.40–1.50)
GFR: 69.52 mL/min
Glucose, Bld: 126 mg/dL — ABNORMAL HIGH (ref 70–99)
Potassium: 3.6 meq/L (ref 3.5–5.1)
Sodium: 136 meq/L (ref 135–145)
Total Bilirubin: 1 mg/dL (ref 0.2–1.2)
Total Protein: 7.4 g/dL (ref 6.0–8.3)

## 2021-03-02 LAB — CBC
HCT: 43.7 % (ref 39.0–52.0)
Hemoglobin: 14.9 g/dL (ref 13.0–17.0)
MCHC: 34.2 g/dL (ref 30.0–36.0)
MCV: 89.7 fl (ref 78.0–100.0)
Platelets: 269 10*3/uL (ref 150.0–400.0)
RBC: 4.87 Mil/uL (ref 4.22–5.81)
RDW: 13.9 % (ref 11.5–15.5)
WBC: 4.4 10*3/uL (ref 4.0–10.5)

## 2021-03-02 LAB — LIPID PANEL
Cholesterol: 198 mg/dL (ref 0–200)
HDL: 49 mg/dL
LDL Cholesterol: 117 mg/dL — ABNORMAL HIGH (ref 0–99)
NonHDL: 149.1
Total CHOL/HDL Ratio: 4
Triglycerides: 161 mg/dL — ABNORMAL HIGH (ref 0.0–149.0)
VLDL: 32.2 mg/dL (ref 0.0–40.0)

## 2021-03-02 NOTE — Progress Notes (Addendum)
Subjective:    Patient ID: Dakota Stanley, male    DOB: 08-26-1981, 40 y.o.   MRN: [id number]   HPI:  Pt presents to the clinic today for his subsequent Medicare Wellness Exam.  Past Medical History:  Diagnosis Date  . HSV-1 (herpes simplex virus 1) infection   . Inguinal hernia   . Legally blind   . Meningitis     Current Outpatient Medications  Medication Sig Dispense Refill  . Glycerin-Hypromellose-PEG 400 0.2-0.2-1 % SOLN Apply to eye.    . valACYclovir (VALTREX) 1000 MG tablet Take 1 tablet by mouth daily.     No current facility-administered medications for this visit.    Allergies  Allergen Reactions  . Shellfish Allergy Other (See Comments), Shortness Of Breath and Swelling    Tongue swelling  Other reaction(s): Angioedema / Facial Swelling Swelling tongue   . Morphine And Related Hives    Family History  Problem Relation Age of Onset  . Hypertension Mother   . Cerebral aneurysm Father   . Healthy Sister   . Healthy Brother   . Healthy Brother     Social History   Socioeconomic History  . Marital status: Single    Spouse name: Not on file  . Number of children: Not on file  . Years of education: Not on file  . Highest education level: Not on file  Occupational History  . Occupation: unemployed - disabled  Tobacco Use  . Smoking status: Current Some Day Smoker    Types: E-cigarettes  . Smokeless tobacco: Never Used  . Tobacco comment: Single cartridge over with THC  Vaping Use  . Vaping Use: Some days  Substance and Sexual Activity  . Alcohol use: Yes    Comment: occassionally, social once per month 15 beverages over 8 hours  . Drug use: Yes    Types: Marijuana    Comment: vapor/oil with THC  . Sexual activity: Not Currently  Other Topics Concern  . Not on file  Social History Narrative  . Not on file   Social Determinants of Health   Financial Resource Strain: Not on file  Food Insecurity: Not on file  Transportation Needs: Not  on file  Physical Activity: Not on file  Stress: Not on file  Social Connections: Not on file  Intimate Partner Violence: Not on file    Hospitiliaztions: None  Health Maintenance:    Flu: never  Tetanus: 2013  Eye Doctor: annually  Dental Exam: annually   Providers:   PCP: Nicki Reaper, NP  I have personally reviewed and have noted:  1. The patient's medical and social history 2. Their use of alcohol, tobacco or illicit drugs 3. Their current medications and supplements 4. The patient's functional ability including ADL's, fall risks, home safety risks and hearing or visual impairment. 5. Diet and physical activities 6. Evidence for depression or mood disorder  Subjective:   Review of Systems:   Constitutional: Denies fever, malaise, fatigue, headache or abrupt weight changes.  HEENT: Pt reports vision problem. Denies eye pain, eye redness, ear pain, ringing in the ears, wax buildup, runny nose, nasal congestion, bloody nose, or sore throat. Respiratory: Denies difficulty breathing, shortness of breath, cough or sputum production.   Cardiovascular: Denies chest pain, chest tightness, palpitations or swelling in the hands or feet.  Gastrointestinal: Denies abdominal pain, bloating, constipation, diarrhea or blood in the stool.  GU: Denies urgency, frequency, pain with urination, burning sensation, blood in urine, odor or discharge. Musculoskeletal:  Denies decrease in range of motion, difficulty with gait, muscle pain or joint pain and swelling.  Skin: Denies redness, rashes, lesions or ulcercations.  Neurological: Denies dizziness, difficulty with memory, difficulty with speech or problems with balance and coordination.  Psych: Denies anxiety, depression, SI/HI.  No other specific complaints in a complete review of systems (except as listed in HPI above).  Objective:  PE:   BP 122/84   Pulse 98   Temp (!) 97 F (36.1 C) (Temporal)   Ht 5\' 11"  (1.803 m)   Wt 176  lb (79.8 kg)   SpO2 98%   BMI 24.55 kg/m  Wt Readings from Last 3 Encounters:  03/02/21 176 lb (79.8 kg)  01/02/21 180 lb (81.6 kg)  02/05/20 190 lb 3.2 oz (86.3 kg)    General: Appears his stated age, well developed, well nourished in NAD. Skin: Warm, dry and intact. No rashes noted. HEENT: Head: normal shape and size; Eyes: sclera white, no icterus, conjunctiva pink, PERRLA and EOMs intact;  Neck: Neck supple, trachea midline. No masses, lumps or thyromegaly present.  Cardiovascular: Normal rate and rhythm. S1,S2 noted.  No murmur, rubs or gallops noted. No JVD or BLE edema. Pulmonary/Chest: Normal effort and positive vesicular breath sounds. No respiratory distress. No wheezes, rales or ronchi noted.  Abdomen: Soft and nontender. Normal bowel sounds. No distention or masses noted. Liver, spleen and kidneys non palpable. Musculoskeletal: Strength 5/5 BUE/BLE. No signs of joint swelling.  Neurological: Alert and oriented. Cranial nerves II-XII grossly intact. Coordination normal.  Psychiatric: Mood and affect normal. Behavior is normal. Judgment and thought content normal.     BMET    Component Value Date/Time   NA 136 03/02/2021 0935   K 3.6 03/02/2021 0935   CL 102 03/02/2021 0935   CO2 27 03/02/2021 0935   GLUCOSE 126 (H) 03/02/2021 0935   BUN 17 03/02/2021 0935   CREATININE 1.28 03/02/2021 0935   CREATININE 1.28 02/24/2020 1003   CALCIUM 10.0 03/02/2021 0935   GFRNONAA 70 02/24/2020 1003   GFRAA 81 02/24/2020 1003    Lipid Panel     Component Value Date/Time   CHOL 198 03/02/2021 0935   TRIG 161.0 (H) 03/02/2021 0935   HDL 49.00 03/02/2021 0935   CHOLHDL 4 03/02/2021 0935   VLDL 32.2 03/02/2021 0935   LDLCALC 117 (H) 03/02/2021 0935   LDLCALC 136 (H) 02/05/2020 1121    CBC    Component Value Date/Time   WBC 4.4 03/02/2021 0935   RBC 4.87 03/02/2021 0935   HGB 14.9 03/02/2021 0935   HCT 43.7 03/02/2021 0935   PLT 269.0 03/02/2021 0935   MCV 89.7  03/02/2021 0935   MCH 30.2 02/05/2020 1121   MCHC 34.2 03/02/2021 0935   RDW 13.9 03/02/2021 0935   LYMPHSABS 952 02/05/2020 1121   EOSABS 52 02/05/2020 1121   BASOSABS 42 02/05/2020 1121    Hgb A1C Lab Results  Component Value Date   HGBA1C 5.7 03/03/2021      Assessment and Plan:   Medicare Annual Wellness Visit:  Diet: He does eat meat. He consumes fruits and veggies. He does eat some fried foods.  Physical activity: Walking  Depression/mood screen: Negative, PHQ 9 score of 0 Hearing: Intact to whispered voice Visual acuity: Legally blind, performs annual eye exam  ADLs: Capable Fall risk: None Home safety: Good Cognitive evaluation: Intact to orientation, naming, recall and repetition EOL planning: No adv directives, full code/ I agree  Preventative Medicine: He declines  flu shot. Tetanus UTD. Encouraged him to get a Covid vaccine. Encouraged him to consume a balanced diet and exercise regimen. Advised him to see an eye doctor and dentist annually. Will check CBC, CMET, Lipid, HIV, RPR, Hep C, urine gonorrhea, chlamydia and trichomonas. Due dates for screening exams given to patient as part of his AVS.   Next appointment: 1 year, Medicare Wellness Exam   Nicki Reaper, NP  This visit occurred during the SARS-CoV-2 public health emergency.  Safety protocols were in place, including screening questions prior to the visit, additional usage of staff PPE, and extensive cleaning of exam room while observing appropriate contact time as indicated for disinfecting solutions.

## 2021-03-02 NOTE — Patient Instructions (Signed)

## 2021-03-03 ENCOUNTER — Other Ambulatory Visit (INDEPENDENT_AMBULATORY_CARE_PROVIDER_SITE_OTHER): Payer: Medicare HMO

## 2021-03-03 DIAGNOSIS — R739 Hyperglycemia, unspecified: Secondary | ICD-10-CM

## 2021-03-03 LAB — HIV ANTIBODY (ROUTINE TESTING W REFLEX): HIV 1&2 Ab, 4th Generation: NONREACTIVE

## 2021-03-03 LAB — RPR: RPR Ser Ql: NONREACTIVE

## 2021-03-03 LAB — C. TRACHOMATIS/N. GONORRHOEAE RNA
C. trachomatis RNA, TMA: NOT DETECTED
N. gonorrhoeae RNA, TMA: NOT DETECTED

## 2021-03-03 LAB — TRICHOMONAS VAGINALIS RNA, QL,MALES: Trichomonas vaginalis RNA: NOT DETECTED

## 2021-03-03 LAB — HEPATITIS C ANTIBODY
Hepatitis C Ab: NONREACTIVE
SIGNAL TO CUT-OFF: 0.01 (ref <1.00)

## 2021-03-03 LAB — HEMOGLOBIN A1C: Hgb A1c MFr Bld: 5.7 % (ref 4.6–6.5)

## 2021-03-04 NOTE — Addendum Note (Signed)
Addended by: Lorre Munroe on: 03/04/2021 08:19 AM   Modules accepted: Level of Service

## 2021-06-20 ENCOUNTER — Other Ambulatory Visit: Payer: Self-pay | Admitting: Family

## 2021-06-20 ENCOUNTER — Other Ambulatory Visit: Payer: Self-pay | Admitting: Internal Medicine

## 2021-06-20 MED ORDER — VALACYCLOVIR HCL 1 G PO TABS: 1000.0000 mg | ORAL_TABLET | Freq: Two times a day (BID) | ORAL | 2 refills | Status: DC

## 2021-06-20 NOTE — Telephone Encounter (Signed)
  Last office visit 03/02/21 Last refill 02/05/20 #180/1 valtrex 500 two times a day No upcoming appointment scheduled

## 2021-06-20 NOTE — Telephone Encounter (Signed)
  LAST APPOINTMENT DATE:  Please schedule appointment if longer than 1 year  NEXT APPOINTMENT DATE:@Visit  date not found  MEDICATION: valACYclovir (VALTREX) 1000 MG tablet  Is the patient out of medication? yes  PHARMACY: [facility]- [address]  Let patient know to contact pharmacy at the end of the day to make sure medication is ready.  Please notify patient to allow 48-72 hours to process  Encourage patient to contact the pharmacy for refills or they can request refills through [facility]  CLINICAL FILLS OUT ALL BELOW:   LAST REFILL:  QTY:  REFILL DATE:    OTHER COMMENTS:    Okay for refill?  Please advise

## 2021-07-04 ENCOUNTER — Other Ambulatory Visit: Payer: Self-pay

## 2021-07-04 ENCOUNTER — Ambulatory Visit (INDEPENDENT_AMBULATORY_CARE_PROVIDER_SITE_OTHER): Payer: Medicare HMO | Admitting: Nurse Practitioner

## 2021-07-04 ENCOUNTER — Encounter: Payer: Self-pay | Admitting: Nurse Practitioner

## 2021-07-04 VITALS — BP 104/76 | HR 82 | Temp 98.0°F | Ht 71.0 in | Wt 178.2 lb

## 2021-07-04 DIAGNOSIS — Z7251 High risk heterosexual behavior: Secondary | ICD-10-CM | POA: Diagnosis not present

## 2021-07-04 DIAGNOSIS — B009 Herpesviral infection, unspecified: Secondary | ICD-10-CM

## 2021-07-04 MED ORDER — VALACYCLOVIR HCL 500 MG PO TABS: 500.0000 mg | ORAL_TABLET | Freq: Two times a day (BID) | ORAL | 1 refills | Status: DC

## 2021-07-04 NOTE — Patient Instructions (Addendum)
Will be in touch with your lab results.  Follow up as needed. Will get you scheduled for wellness exam in March of 2023.

## 2021-07-04 NOTE — Progress Notes (Signed)
Established Patient Office Visit  Subjective:  Patient ID: Dakota Stanley, male    DOB: 07/10/1981  Age: 39 y.o. 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 Diagnosis 2011. Dr. Charmian Muff [place]. Been in [place] for 3 years. [place]. Last year. Due towards the end of 2022  Legally Blind: States use to see Dr. Hunt Oris? In [place]. Has been in [place] for approx 3 years and has established with a eye professional locally  STI: New partner and having unprotected sex. No symptoms currently.    Past Medical History:  Diagnosis Date   HSV-1 (herpes simplex virus 1) infection    Inguinal hernia    Legally blind    Meningitis     Past Surgical History:  Procedure Laterality Date   BACK SURGERY     EYE SURGERY     INGUINAL HERNIA REPAIR      Family History  Problem Relation Age of Onset   Hypertension Mother    Cerebral aneurysm Father    Healthy Sister    Healthy Brother    Healthy Brother     Social History   Socioeconomic History   Marital status: Single    Spouse name: Not on file   Number of children: Not on file   Years of education: Not on file   Highest education level: Not on file  Occupational History   Occupation: unemployed - disabled  Tobacco Use   Smoking status: Some Days    Types: E-cigarettes   Smokeless tobacco: Never   Tobacco comments:    Single cartridge over with THC  Vaping Use   Vaping Use: Some days  Substance and Sexual Activity   Alcohol use: Yes    Comment: occassionally, social once per month 15 beverages over 8 hours   Drug use: Yes    Types: Marijuana    Comment: vapor/oil with THC   Sexual activity: Not Currently  Other Topics Concern   Not on file  Social History Narrative   Not on file   Social  Determinants of Health   Financial Resource Strain: Not on file  Food Insecurity: Not on file  Transportation Needs: Not on file  Physical Activity: Not on file  Stress: Not on file  Social Connections: Not on file  Intimate Partner Violence: Not on file    Outpatient Medications Prior to Visit  Medication Sig Dispense Refill   Carboxymethylcellulose Sodium (ARTIFICIAL TEARS OP) Apply 2 drops to eye daily as needed.     Multiple Vitamins-Minerals (MULTIVITAMIN WITH MINERALS) tablet Take 1 tablet by mouth daily.     valACYclovir (VALTREX) 1000 MG tablet Take 1 tablet (1,000 mg total) by mouth 2 (two) times daily. 10 tablet 2   hydroxypropyl methylcellulose / hypromellose (ISOPTO TEARS / GONIOVISC) 2.5 % ophthalmic solution 1 drop.     Glycerin-Hypromellose-PEG 400 0.2-0.2-1 % SOLN Apply to eye.     No facility-administered medications prior to visit.    Allergies  Allergen Reactions   Shellfish Allergy Other (See Comments), Shortness Of Breath and Swelling    Tongue swelling  Other reaction(s): Angioedema / Facial Swelling Swelling tongue    Morphine And Related Hives    ROS Review of Systems  Constitutional:  Negative for chills and  fever.  Respiratory:  Negative for shortness of breath.   Cardiovascular:  Negative for chest pain.  Gastrointestinal:  Negative for diarrhea, nausea and vomiting.  Genitourinary:  Negative for genital sores, penile discharge, penile pain, penile swelling, scrotal swelling and testicular pain.  Skin:  Negative for rash.  Allergic/Immunologic: Negative for immunocompromised state.     Objective:    Physical Exam Vitals and nursing note reviewed.  Constitutional:      Appearance: He is normal weight.  Eyes:     Comments: Wears corrective lenses. Right sided blindness  Cardiovascular:     Rate and Rhythm: Normal rate and regular rhythm.  Pulmonary:     Effort: Pulmonary effort is normal.     Breath sounds: Normal breath sounds.   Abdominal:     General: Bowel sounds are normal.     Palpations: Abdomen is soft.     Tenderness: There is no abdominal tenderness.  Neurological:     Mental Status: He is alert.    BP 104/76   Pulse 82   Temp 98 F (36.7 C) (Temporal)   Ht 5\' 11"  (1.803 m)   Wt 178 lb 4 oz (80.9 kg)   SpO2 96%   BMI 24.86 kg/m  Wt Readings from Last 3 Encounters:  07/04/21 178 lb 4 oz (80.9 kg)  03/02/21 176 lb (79.8 kg)  01/02/21 180 lb (81.6 kg)     Health Maintenance Due  Topic Date Due   Pneumococcal Vaccine 58-63 Years old (1 - PCV) Never done   COVID-19 Vaccine (3 - Booster for Pfizer series) 11/02/2020   INFLUENZA VACCINE  07/03/2021    There are no preventive care reminders to display for this patient.  No results found for: TSH Lab Results  Component Value Date   WBC 4.4 03/02/2021   HGB 14.9 03/02/2021   HCT 43.7 03/02/2021   MCV 89.7 03/02/2021   PLT 269.0 03/02/2021   Lab Results  Component Value Date   NA 136 03/02/2021   K 3.6 03/02/2021   CO2 27 03/02/2021   GLUCOSE 126 (H) 03/02/2021   BUN 17 03/02/2021   CREATININE 1.28 03/02/2021   BILITOT 1.0 03/02/2021   ALKPHOS 49 03/02/2021   AST 13 03/02/2021   ALT 11 03/02/2021   PROT 7.4 03/02/2021   ALBUMIN 4.7 03/02/2021   CALCIUM 10.0 03/02/2021   GFR 69.52 03/02/2021   Lab Results  Component Value Date   CHOL 198 03/02/2021   Lab Results  Component Value Date   HDL 49.00 03/02/2021   Lab Results  Component Value Date   LDLCALC 117 (H) 03/02/2021   Lab Results  Component Value Date   TRIG 161.0 (H) 03/02/2021   Lab Results  Component Value Date   CHOLHDL 4 03/02/2021   Lab Results  Component Value Date   HGBA1C 5.7 03/03/2021      Assessment & Plan:   Problem List Items Addressed This Visit       Other   HSV-1 infection    Currently on suppressive therapy.  When patient was in between providers prescription got changed and was for outbreak purposes only.  We will change  prescription back to valacyclovir 500 mg twice daily for suppressive therapy.  We will reevaluate at his wellness visit in March 2023.       Relevant Medications   valACYclovir (VALTREX) 500 MG tablet   High risk heterosexual behavior - Primary    States that he got with a new  partner.  Currently not having any symptoms.  States has been having unprotected sex.  "You cannot trust anybody" would like to be screened for STIs today. Pending labs.  Did review with patient about safe sex practices and gave information at discharge.       Relevant Orders   Trichomonas vaginalis RNA, Ql,Males   C. trachomatis/N. gonorrhoeae RNA   HIV antibody (with reflex)    No orders of the defined types were placed in this encounter.   Follow-up: Return in about 7 months (around 02/01/2022) for wellness exam.   This visit occurred during the SARS-CoV-2 public health emergency.  Safety protocols were in place, including screening questions prior to the visit, additional usage of staff PPE, and extensive cleaning of exam room while observing appropriate contact time as indicated for disinfecting solutions.   Audria Nine, NP

## 2021-07-04 NOTE — Assessment & Plan Note (Signed)
Currently on suppressive therapy.  When patient was in between providers prescription got changed and was for outbreak purposes only.  We will change prescription back to valacyclovir 500 mg twice daily for suppressive therapy.  We will reevaluate at his wellness visit in March 2023.

## 2021-07-04 NOTE — Assessment & Plan Note (Signed)
States that he got with a new partner.  Currently not having any symptoms.  States has been having unprotected sex.  "You cannot trust anybody" would like to be screened for STIs today. Pending labs.  Did review with patient about safe sex practices and gave information at discharge.

## 2021-07-05 LAB — HIV ANTIBODY (ROUTINE TESTING W REFLEX): HIV 1&2 Ab, 4th Generation: NONREACTIVE

## 2021-07-05 LAB — TRICHOMONAS VAGINALIS RNA, QL,MALES: Trichomonas vaginalis RNA: NOT DETECTED

## 2021-07-05 LAB — C. TRACHOMATIS/N. GONORRHOEAE RNA
C. trachomatis RNA, TMA: NOT DETECTED
N. gonorrhoeae RNA, TMA: NOT DETECTED

## 2021-09-28 DIAGNOSIS — H209 Unspecified iridocyclitis: Secondary | ICD-10-CM | POA: Diagnosis not present

## 2021-09-28 DIAGNOSIS — H44431 Hypotony of eye due to other ocular disorders, right eye: Secondary | ICD-10-CM | POA: Diagnosis not present

## 2021-10-17 DIAGNOSIS — H3321 Serous retinal detachment, right eye: Secondary | ICD-10-CM | POA: Diagnosis not present

## 2021-11-30 ENCOUNTER — Encounter: Payer: Medicare HMO | Admitting: Family

## 2021-12-18 ENCOUNTER — Encounter: Payer: Medicare HMO | Admitting: Family

## 2021-12-22 ENCOUNTER — Ambulatory Visit (INDEPENDENT_AMBULATORY_CARE_PROVIDER_SITE_OTHER): Payer: Medicare HMO | Admitting: Family

## 2021-12-22 ENCOUNTER — Other Ambulatory Visit: Payer: Self-pay

## 2021-12-22 ENCOUNTER — Encounter: Payer: Self-pay | Admitting: Family

## 2021-12-22 VITALS — BP 126/80 | HR 74 | Temp 96.7°F | Ht 71.0 in | Wt 178.0 lb

## 2021-12-22 DIAGNOSIS — Z79899 Other long term (current) drug therapy: Secondary | ICD-10-CM | POA: Diagnosis not present

## 2021-12-22 DIAGNOSIS — H548 Legal blindness, as defined in USA: Secondary | ICD-10-CM

## 2021-12-22 DIAGNOSIS — E785 Hyperlipidemia, unspecified: Secondary | ICD-10-CM

## 2021-12-22 DIAGNOSIS — Z3009 Encounter for other general counseling and advice on contraception: Secondary | ICD-10-CM | POA: Insufficient documentation

## 2021-12-22 DIAGNOSIS — Z113 Encounter for screening for infections with a predominantly sexual mode of transmission: Secondary | ICD-10-CM | POA: Insufficient documentation

## 2021-12-22 DIAGNOSIS — N529 Male erectile dysfunction, unspecified: Secondary | ICD-10-CM

## 2021-12-22 DIAGNOSIS — B009 Herpesviral infection, unspecified: Secondary | ICD-10-CM | POA: Diagnosis not present

## 2021-12-22 DIAGNOSIS — F1210 Cannabis abuse, uncomplicated: Secondary | ICD-10-CM | POA: Diagnosis not present

## 2021-12-22 DIAGNOSIS — Z87890 Personal history of sex reassignment: Secondary | ICD-10-CM

## 2021-12-22 DIAGNOSIS — Z0001 Encounter for general adult medical examination with abnormal findings: Secondary | ICD-10-CM

## 2021-12-22 LAB — COMPREHENSIVE METABOLIC PANEL
ALT: 12 U/L (ref 0–53)
AST: 14 U/L (ref 0–37)
Albumin: 4.9 g/dL (ref 3.5–5.2)
Alkaline Phosphatase: 57 U/L (ref 39–117)
BUN: 16 mg/dL (ref 6–23)
CO2: 31 mEq/L (ref 19–32)
Calcium: 10 mg/dL (ref 8.4–10.5)
Chloride: 102 mEq/L (ref 96–112)
Creatinine, Ser: 1.21 mg/dL (ref 0.40–1.50)
GFR: 73.95 mL/min (ref >60.00)
Glucose, Bld: 98 mg/dL (ref 70–99)
Potassium: 3.6 mEq/L (ref 3.5–5.1)
Sodium: 139 mEq/L (ref 135–145)
Total Bilirubin: 0.7 mg/dL (ref 0.2–1.2)
Total Protein: 7.7 g/dL (ref 6.0–8.3)

## 2021-12-22 LAB — CBC WITH DIFFERENTIAL/PLATELET
Basophils Absolute: 0 10*3/uL (ref 0.0–0.1)
Basophils Relative: 1.2 % (ref 0.0–3.0)
Eosinophils Absolute: 0.1 10*3/uL (ref 0.0–0.7)
Eosinophils Relative: 3 % (ref 0.0–5.0)
HCT: 44.5 % (ref 39.0–52.0)
Hemoglobin: 15 g/dL (ref 13.0–17.0)
Lymphocytes Relative: 27.2 % (ref 12.0–46.0)
Lymphs Abs: 1.1 10*3/uL (ref 0.7–4.0)
MCHC: 33.6 g/dL (ref 30.0–36.0)
MCV: 89.9 fl (ref 78.0–100.0)
Monocytes Absolute: 0.4 10*3/uL (ref 0.1–1.0)
Monocytes Relative: 11 % (ref 3.0–12.0)
Neutro Abs: 2.3 10*3/uL (ref 1.4–7.7)
Neutrophils Relative %: 57.6 % (ref 43.0–77.0)
Platelets: 276 10*3/uL (ref 150.0–400.0)
RBC: 4.95 Mil/uL (ref 4.22–5.81)
RDW: 12.9 % (ref 11.5–15.5)
WBC: 4 10*3/uL (ref 4.0–10.5)

## 2021-12-22 LAB — LIPID PANEL
Cholesterol: 195 mg/dL (ref 0–200)
HDL: 52.9 mg/dL (ref >39.00)
LDL Cholesterol: 118 mg/dL — ABNORMAL HIGH (ref 0–99)
NonHDL: 142.3
Total CHOL/HDL Ratio: 4
Triglycerides: 121 mg/dL (ref 0.0–149.0)
VLDL: 24.2 mg/dL (ref 0.0–40.0)

## 2021-12-22 MED ORDER — VALACYCLOVIR HCL 1 G PO TABS
1000.0000 mg | ORAL_TABLET | Freq: Every day | ORAL | 1 refills | Status: DC
Start: 2021-12-22 — End: 2021-12-22

## 2021-12-22 MED ORDER — VALACYCLOVIR HCL 1 G PO TABS: 1000.0000 mg | ORAL_TABLET | Freq: Every day | ORAL | 3 refills | Status: DC

## 2021-12-22 NOTE — Assessment & Plan Note (Signed)
Continue f/u with opthamologist Dr. Matthew Saras as scheduled.

## 2021-12-22 NOTE — Assessment & Plan Note (Signed)
Patient Counseling(The following topics were reviewed): ° -Nutrition: Stressed importance of moderation in sodium/caffeine intake, saturated fat and cholesterol, caloric balance, sufficient intake of fresh fruits, vegetables, and fiber. ° -Stressed the importance of regular exercise.  ° -Substance Abuse: Discussed cessation/primary prevention of tobacco, alcohol, or other drug use; driving or other dangerous activities under the influence; availability of treatment for abuse.  ° -Injury prevention: Discussed safety belts, safety helmets, smoke detector, smoking near bedding or upholstery.  ° -Sexuality: Discussed sexually transmitted diseases, partner selection, use of condoms, avoidance of unintended pregnancy and contraceptive alternatives.  ° -Dental health: Discussed importance of regular tooth brushing, flossing, and dental visits. ° -Health maintenance and immunizations reviewed. Please refer to Health maintenance section. ° °Return to care in 1 year for next preventative visit ° °

## 2021-12-22 NOTE — Assessment & Plan Note (Signed)
Pt fasting, Ordered lipid panel, pending results. Work on low cholesterol diet and exercise as tolerated

## 2021-12-22 NOTE — Assessment & Plan Note (Signed)
Referral to urology in place.

## 2021-12-22 NOTE — Assessment & Plan Note (Signed)
Refer to urology for eval/treat. Also advised pt marijuana may assist in worsening/causes ED. Will check testosterone level today as well. Pending results.

## 2021-12-22 NOTE — Patient Instructions (Addendum)
A referral was placed today to urology. Please let us know if you have not heard back within 1 week about your referral.  Stop by the lab prior to leaving today. I will notify you of your results once received.    Stop by the lab prior to leaving today. I will notify you of your results once received.   Recommendations on keeping yourself healthy:  - Exercise at least 30-45 minutes a day, 3-4 days a week.  - Eat a low-fat diet with lots of fruits and vegetables, up to 7-9 servings per day.  - Seatbelts can save your life. Wear them always.  - Smoke detectors on every level of your home, check batteries every year.  - Eye Doctor - have an eye exam every 1-2 years  - Safe sex - if you may be exposed to STDs, use a condom.  - Alcohol -  If you drink, do it moderately, less than 2 drinks per day.  - Health Care Power of Attorney. Choose someone to speak for you if you are not able.  - Depression is common in our stressful world.If you're feeling down or losing interest in things you normally enjoy, please come in for a visit.  - Violence - If anyone is threatening or hurting you, please call immediately.  Due to recent changes in healthcare laws, you may see results of your imaging and/or laboratory studies on MyChart before I have had a chance to review them.  I understand that in some cases there may be results that are confusing or concerning to you. Please understand that not all results are received at the same time and often I may need to interpret multiple results in order to provide you with the best plan of care or course of treatment. Therefore, I ask that you please give me 2 business days to thoroughly review all your results before contacting my office for clarification. Should we see a critical lab result, you will be contacted sooner.   I will see you again in one year for your annual comprehensive exam unless otherwise stated and or with acute concerns.  It was a pleasure seeing  you today! Please do not hesitate to reach out with any questions and or concerns.  Regards,   Dakota Stanley

## 2021-12-22 NOTE — Assessment & Plan Note (Signed)
rx valtrex sent to pharmacy, continue with suppression will monitor CMP

## 2021-12-22 NOTE — Progress Notes (Signed)
Established Patient Office Visit  Subjective:  Patient ID: Dakota Stanley, male    DOB: 08-04-1981  Age: 41 y.o. MRN: [id number]  CC: No chief complaint on file.   HPI Dakota Stanley is here today for transition of care.  Previously seeing Webb Silversmith, FNP.   Pt states that he would like to consider a vasectomy, as he no longer wants any children.   Does have acute concern with erectile dysfunction. He states sometimes hard to maintain an erection. Does admit to smoking THC about three times weekly.   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 Does see Dr. Roosevelt Locks about once yearly (opthamology), did have retinal detachment right eye, resolved back in 2021.   Sexually active: currently with a new partner, and states that he only has one partner at current. Does have unprotected sex with her. Wants to be screen for std. Currently without any STD symptoms.   HSV1: takes 1 g valtrex once daily for suppression.    Past Medical History:  Diagnosis Date   HSV-1 (herpes simplex virus 1) infection    Inguinal hernia    Legally blind    Meningitis    Total retinal detachment of right eye 04/05/2017    Past Surgical History:  Procedure Laterality Date   BACK SURGERY     EYE SURGERY     INGUINAL HERNIA REPAIR      Family History  Problem Relation Age of Onset   Hypertension Mother    Cerebral aneurysm Father    Healthy Sister    Healthy Brother    Healthy Brother    Heart disease Paternal Grandmother     Social History   Socioeconomic History   Marital status: Single    Spouse name: Not on file   Number of children: Not on file   Years of education: Not on file   Highest education level: Not on file  Occupational History   Occupation: unemployed - disabled  Tobacco Use   Smoking status: Former    Types: E-cigarettes   Smokeless tobacco: Never   Tobacco comments:    Single cartridge over with  THC  Vaping Use   Vaping Use: Former   Substances: THC  Substance and Sexual Activity   Alcohol use: Yes    Comment: three times a week, [place] with coke or beer   Drug use: Yes    Types: Marijuana    Comment: three hits three times a week   Sexual activity: Yes    Partners: Female    Comment: new partner in the last few months  Other Topics Concern   Not on file  Social History Narrative   Not on file   Social Determinants of Health   Financial Resource Strain: Not on file  Food Insecurity: Not on file  Transportation Needs: Not on file  Physical Activity: Not on file  Stress: Not on file  Social Connections: Not on file  Intimate Partner Violence: Not on file    Outpatient Medications Prior to Visit  Medication Sig Dispense Refill   Carboxymethylcellulose Sodium (ARTIFICIAL TEARS OP) Apply 2 drops to eye daily as needed.     Multiple Vitamins-Minerals (MULTIVITAMIN WITH MINERALS) tablet Take 1 tablet by mouth daily.     valACYclovir (VALTREX) 500 MG tablet Take 1 tablet (500 mg total) by mouth 2 (two) times daily. 180 tablet 1   No facility-administered medications  prior to visit.    Allergies  Allergen Reactions   Shellfish Allergy Other (See Comments), Shortness Of Breath and Swelling    Tongue swelling  Other reaction(s): Angioedema / Facial Swelling Swelling tongue    Morphine And Related Hives    ROS Review of Systems  Constitutional:  Negative for fatigue and unexpected weight change.  Eyes:  Positive for visual disturbance (blind in right eye, chronic). Negative for discharge.  Respiratory:  Negative for cough and shortness of breath.   Cardiovascular:  Negative for chest pain and palpitations.  Genitourinary:  Negative for dysuria, genital sores, penile discharge, penile pain, testicular pain and urgency.       Difficultly keep an erection   Skin:  Negative for color change.  Neurological:  Negative for syncope and facial asymmetry.        Objective:    Physical Exam Constitutional:      General: He is not in acute distress.    Appearance: Normal appearance. He is normal weight. He is not ill-appearing, toxic-appearing or diaphoretic.  HENT:     Head: Normocephalic.     Right Ear: Tympanic membrane normal.     Left Ear: Tympanic membrane normal.     Nose: Nose normal.  Eyes:     General:        Left eye: No foreign body, discharge or hordeolum.     Extraocular Movements:     Right eye: Normal extraocular motion.     Conjunctiva/sclera: Conjunctivae normal.     Comments: Pt legally blind right eye   Cardiovascular:     Rate and Rhythm: Normal rate and regular rhythm.     Pulses: Normal pulses.     Heart sounds: Normal heart sounds.  Pulmonary:     Effort: Pulmonary effort is normal.     Breath sounds: Normal breath sounds.  Abdominal:     General: Abdomen is flat. Bowel sounds are normal.     Palpations: Abdomen is soft.     Tenderness: There is no abdominal tenderness.  Musculoskeletal:     Cervical back: Full passive range of motion without pain.  Skin:    General: Skin is warm.  Neurological:     General: No focal deficit present.     Mental Status: He is alert and oriented to person, place, and time.  Psychiatric:        Mood and Affect: Mood normal.        Behavior: Behavior normal.        Thought Content: Thought content normal.        Judgment: Judgment normal.     BP 126/80    Pulse 74    Temp (!) 96.7 F (35.9 C) (Temporal)    Ht 5\' 11"  (1.803 m)    Wt 178 lb (80.7 kg)    SpO2 98%    BMI 24.83 kg/m  Wt Readings from Last 3 Encounters:  12/22/21 178 lb (80.7 kg)  07/04/21 178 lb 4 oz (80.9 kg)  03/02/21 176 lb (79.8 kg)     There are no preventive care reminders to display for this patient.   There are no preventive care reminders to display for this patient.  No results found for: TSH Lab Results  Component Value Date   WBC 4.4 03/02/2021   HGB 14.9 03/02/2021   HCT 43.7  03/02/2021   MCV 89.7 03/02/2021   PLT 269.0 03/02/2021   Lab Results  Component Value Date   NA  136 03/02/2021   K 3.6 03/02/2021   CO2 27 03/02/2021   GLUCOSE 126 (H) 03/02/2021   BUN 17 03/02/2021   CREATININE 1.28 03/02/2021   BILITOT 1.0 03/02/2021   ALKPHOS 49 03/02/2021   AST 13 03/02/2021   ALT 11 03/02/2021   PROT 7.4 03/02/2021   ALBUMIN 4.7 03/02/2021   CALCIUM 10.0 03/02/2021   GFR 69.52 03/02/2021   Lab Results  Component Value Date   CHOL 198 03/02/2021   Lab Results  Component Value Date   HDL 49.00 03/02/2021   Lab Results  Component Value Date   LDLCALC 117 (H) 03/02/2021   Lab Results  Component Value Date   TRIG 161.0 (H) 03/02/2021   Lab Results  Component Value Date   CHOLHDL 4 03/02/2021   Lab Results  Component Value Date   HGBA1C 5.7 03/03/2021      Assessment & Plan:   Problem List Items Addressed This Visit       Other   Legally blind    Continue f/u with opthamologist Dr. Zara Chess as scheduled.      HSV-1 infection - Primary    rx valtrex sent to pharmacy, continue with suppression will monitor CMP      Relevant Medications   valACYclovir (VALTREX) 1000 MG tablet   Hyperlipidemia    Pt fasting, Ordered lipid panel, pending results. Work on low cholesterol diet and exercise as tolerated       Relevant Orders   Lipid panel   Encounter for general adult medical examination with abnormal findings    Patient Counseling(The following topics were reviewed):  -Nutrition: Stressed importance of moderation in sodium/caffeine intake, saturated fat and cholesterol, caloric balance, sufficient intake of fresh fruits, vegetables, and fiber.  -Stressed the importance of regular exercise.   -Substance Abuse: Discussed cessation/primary prevention of tobacco, alcohol, or other drug use; driving or other dangerous activities under the influence; availability of treatment for abuse.   -Injury prevention: Discussed safety belts, safety  helmets, smoke detector, smoking near bedding or upholstery.   -Sexuality: Discussed sexually transmitted diseases, partner selection, use of condoms, avoidance of unintended pregnancy and contraceptive alternatives.   -Dental health: Discussed importance of regular tooth brushing, flossing, and dental visits.  -Health maintenance and immunizations reviewed. Please refer to Health maintenance section.  Return to care in 1 year for next preventative visit       Relevant Orders   Lipid panel   CBC w/Diff   Comprehensive metabolic panel   Screening examination for STD (sexually transmitted disease)    Safe sex d/w pt .  Std panel ordered today. Pending results.       Relevant Orders   HSV 1 antibody, IgG   HSV 2 antibody, IgG   HSV 1/2 Ab (IgM), IFA w/rflx Titer   Hepatitis panel, acute   RPR   HIV Antibody (routine testing w rflx)   C. trachomatis/N. gonorrhoeae RNA   Erectile dysfunction    Refer to urology for eval/treat. Also advised pt marijuana may assist in worsening/causes ED. Will check testosterone level today as well. Pending results.      Relevant Orders   Ambulatory referral to Urology   Testos,Total,Free and SHBG (Male)   Vasectomy evaluation    Referral to urology in place.      Relevant Orders   Ambulatory referral to Urology    Meds ordered this encounter  Medications   DISCONTD: valACYclovir (VALTREX) 1000 MG tablet    Sig: Take 1  tablet (1,000 mg total) by mouth daily.    Dispense:  90 tablet    Refill:  1    Order Specific Question:   Supervising Provider    Answer:   BEDSOLE, AMY E [[id number]]   valACYclovir (VALTREX) 1000 MG tablet    Sig: Take 1 tablet (1,000 mg total) by mouth daily.    Dispense:  90 tablet    Refill:  3    Order Specific Question:   Supervising Provider    Answer:   Diona Browner, AMY E [id number]    Follow-up: Return in about 1 year (around 12/22/2022) for annual follow up appointment.    Eugenia Pancoast, FNP

## 2021-12-22 NOTE — Assessment & Plan Note (Signed)
Safe sex d/w pt .  Std panel ordered today. Pending results.   

## 2021-12-25 ENCOUNTER — Encounter: Payer: Self-pay | Admitting: Family

## 2021-12-29 LAB — TESTOS,TOTAL,FREE AND SHBG (FEMALE)
Free Testosterone: 51.4 pg/mL (ref 35.0–155.0)
Sex Hormone Binding: 19 nmol/L (ref 10–50)
Testosterone, Total, LC-MS-MS: 334 ng/dL (ref 250–1100)

## 2021-12-29 LAB — C. TRACHOMATIS/N. GONORRHOEAE RNA
C. trachomatis RNA, TMA: NOT DETECTED
N. gonorrhoeae RNA, TMA: NOT DETECTED

## 2021-12-29 LAB — HEPATITIS PANEL, ACUTE
Hep A IgM: NONREACTIVE
Hep B C IgM: NONREACTIVE
Hepatitis B Surface Ag: NONREACTIVE
Hepatitis C Ab: NONREACTIVE
SIGNAL TO CUT-OFF: <0.02 (ref <1.00)

## 2021-12-29 LAB — HSV 2 ANTIBODY, IGG: HSV 2 Glycoprotein G Ab, IgG: <0.9 index

## 2021-12-29 LAB — HSV 1/2 AB (IGM), IFA W/RFLX TITER
HSV 1 IgM Screen: NEGATIVE
HSV 2 IgM Screen: NEGATIVE

## 2021-12-29 LAB — RPR: RPR Ser Ql: NONREACTIVE

## 2021-12-29 LAB — HIV ANTIBODY (ROUTINE TESTING W REFLEX): HIV 1&2 Ab, 4th Generation: NONREACTIVE

## 2021-12-29 LAB — HSV 1 ANTIBODY, IGG: HSV 1 Glycoprotein G Ab, IgG: <0.9 index

## 2021-12-31 NOTE — Progress Notes (Signed)
STDs are negative. Testosterone is normal , however on the lower range of normal. I would follow up with urology if you want to investigate this further. Also as we discussed marijuana can cause erectile dysfunction.

## 2022-01-03 ENCOUNTER — Ambulatory Visit: Payer: Medicare HMO | Admitting: Urology

## 2022-01-03 ENCOUNTER — Encounter: Payer: Self-pay | Admitting: Urology

## 2022-01-03 ENCOUNTER — Other Ambulatory Visit: Payer: Self-pay

## 2022-01-03 VITALS — BP 125/82 | HR 96 | Ht 72.0 in | Wt 176.0 lb

## 2022-01-03 DIAGNOSIS — N529 Male erectile dysfunction, unspecified: Secondary | ICD-10-CM

## 2022-01-03 DIAGNOSIS — Z3009 Encounter for other general counseling and advice on contraception: Secondary | ICD-10-CM | POA: Diagnosis not present

## 2022-01-03 NOTE — Progress Notes (Signed)
01/03/2022 10:06 AM   Leia Alf 05/11/1981 [id number]  Referring provider: Mort Sawyers, FNP [address] [place],  [place] [address]  Chief Complaint  Patient presents with   VAS Consult   Erectile Dysfunction    HPI: Dakota Stanley is a 42 y.o. male referred for vasectomy consultation and evaluation of erectile dysfunction.  Presently single with 2 grown children In a relationship and using condoms.  Does not desire additional children and requests vasectomy Denies chronic scrotal pain or prior urologic history Previous inguinal hernia surgery No history of bleeding or clotting disorders  Recently has noted some difficulty maintaining an erection which is intermittent Total testosterone level low normal at 334 ng/dL; free testosterone level low normal at 51.4 pg/mL   PMH: Past Medical History:  Diagnosis Date   HSV-1 (herpes simplex virus 1) infection    Inguinal hernia    Legally blind    Meningitis    Total retinal detachment of right eye 04/05/2017    Surgical History: Past Surgical History:  Procedure Laterality Date   BACK SURGERY     EYE SURGERY     INGUINAL HERNIA REPAIR      Home Medications:  Allergies as of 01/03/2022       Reactions   Shellfish Allergy Other (See Comments), Shortness Of Breath, Swelling   Tongue swelling Other reaction(s): Angioedema / Facial Swelling Swelling tongue   Morphine And Related Hives        Medication List        Accurate as of January 03, 2022 10:06 AM. If you have any questions, ask your nurse or doctor.          ARTIFICIAL TEARS OP Apply 2 drops to eye daily as needed.   multivitamin with minerals tablet Take 1 tablet by mouth daily.   valACYclovir 1000 MG tablet Commonly known as: Valtrex Take 1 tablet (1,000 mg total) by mouth daily.        Allergies:  Allergies  Allergen Reactions   Shellfish Allergy Other (See Comments), Shortness Of Breath and Swelling    Tongue  swelling  Other reaction(s): Angioedema / Facial Swelling Swelling tongue    Morphine And Related Hives    Family History: Family History  Problem Relation Age of Onset   Hypertension Mother    Cerebral aneurysm Father    Healthy Sister    Healthy Brother    Healthy Brother    Heart disease Paternal Grandmother     Social History:  reports that he has quit smoking. His smoking use included e-cigarettes. He has never used smokeless tobacco. He reports current alcohol use. He reports current drug use. Drug: Marijuana.   Physical Exam: BP 125/82    Pulse 96    Ht 6' (1.829 m)    Wt 176 lb (79.8 kg)    BMI 23.87 kg/m   Constitutional:  Alert and oriented, No acute distress. HEENT: [place] AT, moist mucus membranes.  Trachea midline, no masses. Cardiovascular: No clubbing, cyanosis, or edema. Respiratory: Normal respiratory effort, no increased work of breathing. GI: Abdomen is soft, nontender, nondistended, no abdominal masses GU: Phallus uncircumcised without lesions.  Testes descended bilaterally out masses or tenderness.  Left vas was difficult to palpate and slightly atretic.  Right vasa easily palpable    Assessment & Plan:    1.  Undesired fertility We had a long discussion about vasectomy. We specifically discussed the procedure, recovery and the risks, benefits and alternatives  of vasectomy. I explained that the procedure entails removal of a segment of each vas deferens, each of which conducts sperm, and that the purpose of this procedure is to cause sterility (inability to produce children or cause pregnancy). Vasectomy is intended to be permanent and irreversible form of contraception. Options for fertility after vasectomy include vasectomy reversal, or sperm retrieval with in vitro fertilization. These options are not always successful, and they may be expensive. We discussed reversible forms of birth control such as condoms, IUD or diaphragms, as well as the option of  freezing sperm in a sperm bank prior to the vasectomy procedure. We discussed the importance of avoiding strenuous exercise for four days after vasectomy, and the importance of refraining from any form of ejaculation for seven days after vasectomy. I explained that vasectomy does not produce immediate sterility so another form of contraceptive must be used until sterility is assured by having semen checked for sperm. Thus, a post vasectomy semen analysis is necessary to confirm sterility. Rarely, vasectomy must be repeated. We discussed the approximately 1 in 2,000 risk of pregnancy after vasectomy for men who have post-vasectomy semen analysis showing absent sperm or rare non-motile sperm. Typical side effects include a small amount of oozing blood, some discomfort and mild swelling in the area of incision.  Vasectomy does not affect sexual performance, function, please, sensation, interest, desire, satisfaction, penile erection, volume of semen or ejaculation. Other rare risks include allergy or adverse reaction to an anesthetic, testicular atrophy, hematoma, infection/abscess, prolonged tenderness of the vas deferens, pain, swelling, painful nodule or scar (called sperm granuloma) or epididymtis. We discussed chronic testicular pain syndrome. This has been reported to occur in as many as 1-2% of men and may be permanent. This can be treated with medication, small procedures or (rarely) surgery. Rx Valium sent to pharmacy and he was informed he would need a driver if utilizing  2.  Erectile dysfunction Intermittent difficulty maintaining an erection. Testosterone level low normal though we discussed insurance would not cover TRT unless levels were abnormally low.  He inquired about over-the-counter testosterone supplements and was informed the results of the supplements are variable.  We discussed resistance training can increase testosterone levels Offered a PDE 5 inhibitor trial however he stated his  symptoms were not that severe   Riki Altes, MD  [facility] [address] [place], [place] [address] [phone]

## 2022-01-04 MED ORDER — DIAZEPAM 10 MG PO TABS: ORAL_TABLET | ORAL | 0 refills | Status: DC

## 2022-02-09 DIAGNOSIS — Z0100 Encounter for examination of eyes and vision without abnormal findings: Secondary | ICD-10-CM | POA: Diagnosis not present

## 2022-02-22 ENCOUNTER — Encounter: Payer: Self-pay | Admitting: Family

## 2022-02-22 ENCOUNTER — Other Ambulatory Visit: Payer: Self-pay

## 2022-02-22 ENCOUNTER — Ambulatory Visit (INDEPENDENT_AMBULATORY_CARE_PROVIDER_SITE_OTHER): Payer: Medicare HMO | Admitting: Family

## 2022-02-22 VITALS — BP 120/88 | HR 90 | Ht 72.0 in | Wt 175.0 lb

## 2022-02-22 DIAGNOSIS — B009 Herpesviral infection, unspecified: Secondary | ICD-10-CM | POA: Diagnosis not present

## 2022-02-22 DIAGNOSIS — M62830 Muscle spasm of back: Secondary | ICD-10-CM | POA: Diagnosis not present

## 2022-02-22 MED ORDER — CYCLOBENZAPRINE HCL 10 MG PO TABS: 10.0000 mg | ORAL_TABLET | Freq: Three times a day (TID) | ORAL | 0 refills | Status: DC | PRN

## 2022-02-22 MED ORDER — VALACYCLOVIR HCL 1 G PO TABS: 1000.0000 mg | ORAL_TABLET | Freq: Every day | ORAL | 3 refills | Status: DC

## 2022-02-22 NOTE — Progress Notes (Signed)
? ?Established Patient Office Visit ? ?Subjective:  ?Patient ID: Dakota Stanley, male    DOB: 1981-11-30  Age: 41 y.o. MRN: [id number] ? ?CC:  ?Chief Complaint  ?Patient presents with  ? Follow-up  ?  Pt stated--vasectomy surgery in not covered and required PA.  ? ? ?HPI ?Dakota Stanley is here today for follow up.  ?Pt is without acute concerns. ? ?Saw urologist for vasectomy however they want a 1200$ copay which his too expensive for him. He is going to hold off for now.  ? ?Middle back pain ongoing for the last year, no known injury or trauma. Hurts when he cracks his back. His back will crack easily. Pain is intermittent, dull and achy like a bruise when it occurs on  the lower mid back. Doesn't take tylenol or ibuprofen when he feels the pain, just lets it stop on its own. Doesn't remember getting an xray. Slight at current.  ? ? ?Past Medical History:  ?Diagnosis Date  ? HSV-1 (herpes simplex virus 1) infection   ? Inguinal hernia   ? Legally blind   ? Meningitis   ? 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 ? ?Past Surgical History:  ?Procedure Laterality Date  ? BACK SURGERY    ? EYE SURGERY    ? INGUINAL HERNIA REPAIR    ? ? ?Family History  ?Problem Relation Age of Onset  ? Hypertension Mother   ? Cerebral aneurysm Father   ? Healthy Sister   ? Healthy Brother   ? Healthy Brother   ? Heart disease Paternal Grandmother   ? ? ?Social History  ? ?Socioeconomic History  ? Marital status: Single  ?  Spouse name: Not on file  ? Number of children: Not on file  ? Years of education: Not on file  ? Highest education level: Not on file  ?Occupational History  ? Occupation: unemployed - disabled  ?Tobacco Use  ? Smoking status: Former  ?  Types: E-cigarettes  ? Smokeless tobacco: Never  ? Tobacco comments:  ?  Single cartridge over with THC  ?Vaping Use  ? Vaping Use: Former  ? Substances: THC  ?Substance and Sexual Activity  ? Alcohol use: Yes  ?  Comment: three times a week, crown royal with coke or beer   ? Drug use: Yes  ?  Types: Marijuana  ?  Comment: three hits three times a week  ? Sexual activity: Yes  ?  Partners: Female  ?  Comment: new partner in the last few months  ?Other Topics Concern  ? Not on file  ?Social History Narrative  ? Not on file  ? ?Social Determinants of Health  ? ?Financial Resource Strain: Not on file  ?Food Insecurity: Not on file  ?Transportation Needs: Not on file  ?Physical Activity: Not on file  ?Stress: Not on file  ?Social Connections: Not on file  ?Intimate Partner Violence: Not on file  ? ? ?Outpatient Medications Prior to Visit  ?Medication Sig Dispense Refill  ? Carboxymethylcellulose Sodium (ARTIFICIAL TEARS OP) Apply 2 drops to eye daily as needed.    ? diazepam (VALIUM) 10 MG tablet 1 tab po 30 min prior to procedure 1 tablet 0  ? Multiple Vitamins-Minerals (MULTIVITAMIN WITH MINERALS) tablet Take 1 tablet by mouth daily.    ? valACYclovir (VALTREX) 1000 MG tablet Take 1 tablet (1,000 mg total) by mouth daily. 90 tablet 3  ? ?No facility-administered medications prior to visit.  ? ? ?  Allergies  ?Allergen Reactions  ? Shellfish Allergy Other (See Comments), Shortness Of Breath and Swelling  ?  Tongue swelling ? ?Other reaction(s): Angioedema / Facial Swelling ?Swelling tongue ?  ? Morphine And Related Hives  ? ? ?ROS ?Review of Systems  ?Constitutional:  Negative for chills, fatigue, fever and unexpected weight change.  ?Eyes:  Negative for visual disturbance.  ?Respiratory:  Negative for shortness of breath.   ?Cardiovascular:  Negative for chest pain.  ?Gastrointestinal:  Negative for abdominal pain.  ?Genitourinary:  Negative for difficulty urinating.  ?Musculoskeletal:  Positive for myalgias (muscle aching lower mid back aggravated by movememt).  ?Skin:  Negative for rash.  ?Neurological:  Negative for dizziness and headaches.  ? ? ?  ?Objective:  ?  ?Physical Exam ?Constitutional:   ?   General: He is awake. He is not in acute distress. ?   Appearance: Normal appearance.  He is not ill-appearing.  ?HENT:  ?   Nose:  ?   Right Turbinates: Not enlarged or swollen.  ?   Left Turbinates: Not enlarged or swollen.  ?   Right Sinus: No maxillary sinus tenderness or frontal sinus tenderness.  ?   Left Sinus: No maxillary sinus tenderness or frontal sinus tenderness.  ?   Mouth/Throat:  ?   Pharynx: No pharyngeal swelling, oropharyngeal exudate or posterior oropharyngeal erythema.  ?Cardiovascular:  ?   Rate and Rhythm: Normal rate and regular rhythm.  ?Pulmonary:  ?   Effort: Pulmonary effort is normal.  ?   Breath sounds: Normal breath sounds. No wheezing.  ?Musculoskeletal:  ?   Lumbar back: Spasms and tenderness present. Normal range of motion. Negative right straight leg raise test and negative left straight leg raise test.  ?     Back: ? ?Neurological:  ?   General: No focal deficit present.  ?   Mental Status: He is alert and oriented to person, place, and time.  ?Psychiatric:     ?   Mood and Affect: Mood normal.     ?   Behavior: Behavior normal.     ?   Thought Content: Thought content normal.     ?   Judgment: Judgment normal.  ? ? ? ? ?BP 120/88   Pulse 90   Ht 6' (1.829 m)   Wt 175 lb (79.4 kg)   SpO2 97%   BMI 23.73 kg/m?  ?Wt Readings from Last 3 Encounters:  ?02/22/22 175 lb (79.4 kg)  ?01/03/22 176 lb (79.8 kg)  ?12/22/21 178 lb (80.7 kg)  ? ? ? ?Health Maintenance Due  ?Topic Date Due  ? COVID-19 Vaccine (3 - Booster for Pfizer series) 07/28/2020  ? ? ?There are no preventive care reminders to display for this patient. ? ?No results found for: TSH ?Lab Results  ?Component Value Date  ? WBC 4.0 12/22/2021  ? HGB 15.0 12/22/2021  ? HCT 44.5 12/22/2021  ? MCV 89.9 12/22/2021  ? PLT 276.0 12/22/2021  ? ?Lab Results  ?Component Value Date  ? NA 139 12/22/2021  ? K 3.6 12/22/2021  ? CO2 31 12/22/2021  ? GLUCOSE 98 12/22/2021  ? BUN 16 12/22/2021  ? CREATININE 1.21 12/22/2021  ? BILITOT 0.7 12/22/2021  ? ALKPHOS 57 12/22/2021  ? AST 14 12/22/2021  ? ALT 12 12/22/2021  ? PROT  7.7 12/22/2021  ? ALBUMIN 4.9 12/22/2021  ? CALCIUM 10.0 12/22/2021  ? GFR 73.95 12/22/2021  ? ?Lab Results  ?Component Value Date  ? CHOL 195  12/22/2021  ? ?Lab Results  ?Component Value Date  ? HDL 52.90 12/22/2021  ? ?Lab Results  ?Component Value Date  ? LDLCALC 118 (H) 12/22/2021  ? ?Lab Results  ?Component Value Date  ? TRIG 121.0 12/22/2021  ? ?Lab Results  ?Component Value Date  ? CHOLHDL 4 12/22/2021  ? ?Lab Results  ?Component Value Date  ? HGBA1C 5.7 03/03/2021  ? ? ?  ?Assessment & Plan:  ? ?Problem List Items Addressed This Visit   ? ?  ? Other  ? HSV-1 infection  ?  Valtrex 1000 mg sent to pharmacy ?Pt to take for suppression ?  ?  ? Relevant Medications  ? valACYclovir (VALTREX) 1000 MG tablet  ? Muscle spasm of back - Primary  ?  Flexeril 10 mg sent to pharmacy ?Pt to take along with ibuprofen during muscle spasm flare ups ?Pt declines xray at this time ,will consider if needed increased frequency ?Avoid heavy lifting for next one week ?Heat compresses to sight ?Exercises printed and handed to pt.  ?  ?  ? Relevant Medications  ? cyclobenzaprine (FLEXERIL) 10 MG tablet  ? ? ?Meds ordered this encounter  ?Medications  ? cyclobenzaprine (FLEXERIL) 10 MG tablet  ?  Sig: Take 1 tablet (10 mg total) by mouth 3 (three) times daily as needed for muscle spasms.  ?  Dispense:  30 tablet  ?  Refill:  0  ?  Order Specific Question:   Supervising Provider  ?  Answer:   BEDSOLE, AMY E [[id number]]  ? valACYclovir (VALTREX) 1000 MG tablet  ?  Sig: Take 1 tablet (1,000 mg total) by mouth daily.  ?  Dispense:  90 tablet  ?  Refill:  3  ?  Order Specific Question:   Supervising Provider  ?  Answer:   BEDSOLE, AMY E [[id number]]  ? ? ?Follow-up: No follow-ups on file.  ? ? ?Eugenia Pancoast, FNP ?

## 2022-02-22 NOTE — Patient Instructions (Addendum)
I recommend you use muscle relaxer when you experience the muscle tightness an work on exercises. ?You can also take an ibuprofen as needed as well.  ? ? It was a pleasure seeing you today! Please do not hesitate to reach out with any questions and or concerns. ? ?Regards,  ? ?Yenny Kosa ?FNP-C ? ?

## 2022-02-25 NOTE — Assessment & Plan Note (Signed)
Flexeril 10 mg sent to pharmacy ?Pt to take along with ibuprofen during muscle spasm flare ups ?Pt declines xray at this time ,will consider if needed increased frequency ?Avoid heavy lifting for next one week ?Heat compresses to sight ?Exercises printed and handed to pt.  ?

## 2022-02-25 NOTE — Assessment & Plan Note (Signed)
Valtrex 1000 mg sent to pharmacy ?Pt to take for suppression ?

## 2022-03-01 ENCOUNTER — Ambulatory Visit (INDEPENDENT_AMBULATORY_CARE_PROVIDER_SITE_OTHER): Payer: Medicare HMO | Admitting: Family

## 2022-03-01 ENCOUNTER — Encounter: Payer: Self-pay | Admitting: Family

## 2022-03-01 VITALS — BP 116/72 | HR 81 | Temp 98.5°F | Resp 16 | Ht 72.0 in | Wt 175.3 lb

## 2022-03-01 DIAGNOSIS — K644 Residual hemorrhoidal skin tags: Secondary | ICD-10-CM

## 2022-03-01 MED ORDER — HYDROCORTISONE (PERIANAL) 1 % EX CREA: TOPICAL_CREAM | CUTANEOUS | 0 refills | Status: DC

## 2022-03-01 NOTE — Patient Instructions (Signed)
It was a pleasure seeing you today! Please do not hesitate to reach out with any questions and or concerns. ° °Regards,  ° °Tylisa Alcivar °FNP-C ° °

## 2022-03-01 NOTE — Assessment & Plan Note (Signed)
rx sent for hydrocortisone cream 1% cream pt to apply as directed ?Avoid straining, avoid constipation.  ?Return if symptoms worsen ?Sitz baths ?Handout given for hemorrhoids ?

## 2022-03-01 NOTE — Progress Notes (Signed)
? ?Established Patient Office Visit ? ?Subjective:  ?Patient ID: Dakota Stanley, male    DOB: 12-13-1980  Age: 41 y.o. MRN: [id number] ? ?CC:  ?Chief Complaint  ?Patient presents with  ? Mass  ?  Is anus   ? ? ?HPI ?JANCARLOS THRUN is here today with concerns.  ? ?Was cleaning himself he noticed a bump on the outside of his anus. It doesn't hurt, it doesn't bleed.  ?Not constipated. Is concerned.  ? ?Recently noticed however was feeling something over the last few months.  ?No constipation or diarrhea.  ? ?Past Medical History:  ?Diagnosis Date  ? HSV-1 (herpes simplex virus 1) infection   ? Inguinal hernia   ? Legally blind   ? Meningitis   ? 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 ? ?Past Surgical History:  ?Procedure Laterality Date  ? BACK SURGERY    ? EYE SURGERY    ? INGUINAL HERNIA REPAIR    ? ? ?Family History  ?Problem Relation Age of Onset  ? Hypertension Mother   ? Cerebral aneurysm Father   ? Healthy Sister   ? Healthy Brother   ? Healthy Brother   ? Heart disease Paternal Grandmother   ? ? ?Social History  ? ?Socioeconomic History  ? Marital status: Single  ?  Spouse name: Not on file  ? Number of children: Not on file  ? Years of education: Not on file  ? Highest education level: Not on file  ?Occupational History  ? Occupation: unemployed - disabled  ?Tobacco Use  ? Smoking status: Former  ?  Types: E-cigarettes  ? Smokeless tobacco: Never  ? Tobacco comments:  ?  Single cartridge over with THC  ?Vaping Use  ? Vaping Use: Former  ? Substances: THC  ?Substance and Sexual Activity  ? Alcohol use: Yes  ?  Comment: three times a week, [place] with coke or beer  ? Drug use: Yes  ?  Types: Marijuana  ?  Comment: three hits three times a week  ? Sexual activity: Yes  ?  Partners: Female  ?  Comment: new partner in the last few months  ?Other Topics Concern  ? Not on file  ?Social History Narrative  ? Not on file  ? ?Social Determinants of Health  ? ?Financial Resource Strain: Not on file  ?Food  Insecurity: Not on file  ?Transportation Needs: Not on file  ?Physical Activity: Not on file  ?Stress: Not on file  ?Social Connections: Not on file  ?Intimate Partner Violence: Not on file  ? ? ?Outpatient Medications Prior to Visit  ?Medication Sig Dispense Refill  ? Carboxymethylcellulose Sodium (ARTIFICIAL TEARS OP) Apply 2 drops to eye daily as needed.    ? cyclobenzaprine (FLEXERIL) 10 MG tablet Take 1 tablet (10 mg total) by mouth 3 (three) times daily as needed for muscle spasms. 30 tablet 0  ? diazepam (VALIUM) 10 MG tablet 1 tab po 30 min prior to procedure 1 tablet 0  ? Multiple Vitamins-Minerals (MULTIVITAMIN WITH MINERALS) tablet Take 1 tablet by mouth daily.    ? valACYclovir (VALTREX) 1000 MG tablet Take 1 tablet (1,000 mg total) by mouth daily. 90 tablet 3  ? ?No facility-administered medications prior to visit.  ? ? ?Allergies  ?Allergen Reactions  ? Shellfish Allergy Other (See Comments), Shortness Of Breath and Swelling  ?  Tongue swelling ? ?Other reaction(s): Angioedema / Facial Swelling ?Swelling tongue ?  ? Morphine And Related  Hives  ? ? ?ROS ?Review of Systems  ?Gastrointestinal:  Positive for rectal pain (rectal mass at entrance of rectum). Negative for abdominal pain, anal bleeding, blood in stool, constipation and diarrhea.  ?Skin:  Negative for rash.  ? ?  ?Objective:  ?  ?Physical Exam ?Constitutional:   ?   General: He is not in acute distress. ?   Appearance: Normal appearance. He is normal weight. He is not ill-appearing, toxic-appearing or diaphoretic.  ?Genitourinary: ?   Rectum: External hemorrhoid (non thrombosed, slightly blue healing) present. No mass, tenderness, anal fissure or internal hemorrhoid.  ?Neurological:  ?   Mental Status: He is alert.  ? ? ?BP 116/72   Pulse 81   Temp 98.5 ?F (36.9 ?C)   Resp 16   Ht 6' (1.829 m)   Wt 175 lb 5 oz (79.5 kg)   SpO2 95%   BMI 23.78 kg/m?  ?Wt Readings from Last 3 Encounters:  ?03/01/22 175 lb 5 oz (79.5 kg)  ?02/22/22 175 lb  (79.4 kg)  ?01/03/22 176 lb (79.8 kg)  ? ? ? ?Health Maintenance Due  ?Topic Date Due  ? COVID-19 Vaccine (3 - Booster for Pfizer series) 07/28/2020  ? ? ?There are no preventive care reminders to display for this patient. ? ?No results found for: TSH ?Lab Results  ?Component Value Date  ? WBC 4.0 12/22/2021  ? HGB 15.0 12/22/2021  ? HCT 44.5 12/22/2021  ? MCV 89.9 12/22/2021  ? PLT 276.0 12/22/2021  ? ?Lab Results  ?Component Value Date  ? NA 139 12/22/2021  ? K 3.6 12/22/2021  ? CO2 31 12/22/2021  ? GLUCOSE 98 12/22/2021  ? BUN 16 12/22/2021  ? CREATININE 1.21 12/22/2021  ? BILITOT 0.7 12/22/2021  ? ALKPHOS 57 12/22/2021  ? AST 14 12/22/2021  ? ALT 12 12/22/2021  ? PROT 7.7 12/22/2021  ? ALBUMIN 4.9 12/22/2021  ? CALCIUM 10.0 12/22/2021  ? GFR 73.95 12/22/2021  ? ?Lab Results  ?Component Value Date  ? HGBA1C 5.7 03/03/2021  ? ? ?  ?Assessment & Plan:  ? ?Problem List Items Addressed This Visit   ? ?  ? Cardiovascular and Mediastinum  ? External hemorrhoids - Primary  ?  rx sent for hydrocortisone cream 1% cream pt to apply as directed ?Avoid straining, avoid constipation.  ?Return if symptoms worsen ?Sitz baths ?Handout given for hemorrhoids ?  ?  ? Relevant Medications  ? Hydrocortisone, Perianal, (PROCTO-PAK) 1 % CREA  ? ? ?Meds ordered this encounter  ?Medications  ? Hydrocortisone, Perianal, (PROCTO-PAK) 1 % CREA  ?  Sig: apply twice daily to rectum up to two weeks prn  ?  Dispense:  28 g  ?  Refill:  0  ?  Order Specific Question:   Supervising Provider  ?  Answer:   BEDSOLE, AMY E [[id number]]  ? ? ?Follow-up: Return if symptoms worsen or fail to improve.  ? ? ?Mort Sawyers, FNP ?

## 2022-03-29 DIAGNOSIS — R69 Illness, unspecified: Secondary | ICD-10-CM | POA: Diagnosis not present

## 2022-04-02 DIAGNOSIS — Z98818 Other dental procedure status: Secondary | ICD-10-CM | POA: Diagnosis not present

## 2022-04-02 DIAGNOSIS — Z872 Personal history of diseases of the skin and subcutaneous tissue: Secondary | ICD-10-CM | POA: Diagnosis not present

## 2022-04-02 DIAGNOSIS — H4489 Other disorders of globe: Secondary | ICD-10-CM | POA: Diagnosis not present

## 2022-04-02 DIAGNOSIS — Z8719 Personal history of other diseases of the digestive system: Secondary | ICD-10-CM | POA: Diagnosis not present

## 2022-04-02 DIAGNOSIS — H9202 Otalgia, left ear: Secondary | ICD-10-CM | POA: Diagnosis not present

## 2022-04-02 DIAGNOSIS — R6884 Jaw pain: Secondary | ICD-10-CM | POA: Diagnosis not present

## 2022-04-02 DIAGNOSIS — K047 Periapical abscess without sinus: Secondary | ICD-10-CM | POA: Diagnosis not present

## 2022-04-02 DIAGNOSIS — G936 Cerebral edema: Secondary | ICD-10-CM | POA: Diagnosis not present

## 2022-04-02 DIAGNOSIS — E871 Hypo-osmolality and hyponatremia: Secondary | ICD-10-CM | POA: Diagnosis not present

## 2022-04-02 DIAGNOSIS — R600 Localized edema: Secondary | ICD-10-CM | POA: Diagnosis not present

## 2022-04-02 DIAGNOSIS — R220 Localized swelling, mass and lump, head: Secondary | ICD-10-CM | POA: Diagnosis not present

## 2022-04-02 DIAGNOSIS — R000 Tachycardia, unspecified: Secondary | ICD-10-CM | POA: Diagnosis not present

## 2022-04-02 DIAGNOSIS — E878 Other disorders of electrolyte and fluid balance, not elsewhere classified: Secondary | ICD-10-CM | POA: Diagnosis not present

## 2022-04-06 ENCOUNTER — Encounter (HOSPITAL_COMMUNITY): Payer: Self-pay

## 2022-04-06 ENCOUNTER — Ambulatory Visit (HOSPITAL_COMMUNITY)
Admission: EM | Admit: 2022-04-06 | Discharge: 2022-04-06 | Disposition: A | Discharge status: Home / Self Care | Payer: Medicare HMO

## 2022-04-06 ENCOUNTER — Telehealth: Payer: Self-pay

## 2022-04-06 DIAGNOSIS — K0889 Other specified disorders of teeth and supporting structures: Secondary | ICD-10-CM | POA: Diagnosis not present

## 2022-04-06 DIAGNOSIS — R220 Localized swelling, mass and lump, head: Secondary | ICD-10-CM

## 2022-04-06 NOTE — ED Triage Notes (Signed)
On 4/27 Pt had his lower bilateral wisdom teeth removed. He c/o continued facial swelling and pain. Has been taking clindamycin. No trouble chewing or swallowing. ?

## 2022-04-06 NOTE — ED Notes (Signed)
Patient is being discharged from the Urgent Care and sent to the Emergency Department via POV . Per Cathlean Marseilles, NP patient is in need of higher level of care due to evaluation. Patient is aware and verbalizes understanding of plan of care.  ?Vitals:  ? 04/06/22 1832  ?BP: 118/84  ?Pulse: 87  ?Resp: 17  ?Temp: 98.9 ?F (37.2 ?C)  ?SpO2: 96%  ? ? ?

## 2022-04-06 NOTE — Discharge Instructions (Addendum)
-   I recommend you go to the Emergency Room for further evaluation ?

## 2022-04-06 NOTE — Telephone Encounter (Signed)
[facility] Day - Client ?TELEPHONE ADVICE RECORD ?AccessNurse? ?Patient ?Name: ?Dakota MAR ?Stanley ?Gender: Male ?DOB: 1981/04/06 ?Age: 41 Y 7 M 7 D ?Return ?Phone ?Number: ?[phone] ?(Primary) ?Address: ?City/ ?State/ ?Zip: [place] ? [address] ?Client [facility] Day - Client ?Client Site [facility] - Day ?Provider AA - PHYSICIAN, NOT LISTED- MD ?Contact Type Call ?Who Is Calling Patient / Member / Family / Caregiver ?Call Type Triage / Clinical ?Relationship To Patient Self ?Return Phone Number [phone] (Primary) ?Chief Complaint Facial Swelling ?Reason for Call Symptomatic / Request for Health Information ?Initial Comment Caller states a patient was prescribed a rx from the ?hospital and is having a reaction to it. ?Additional Comment He just feels weird, his arm is going numb. His ?face is swollen as well. ?Translation No ?Nurse Assessment ?Nurse: Rolena Infante, RN, [place] Date/Time (Eastern Time): 04/06/2022 3:37:55 PM ?Confirm and document reason for call. If ?symptomatic, describe symptoms. ?---Caller states a patient was prescribed a rx from the ?hospital and is having a reaction to it. He just feels ?weird and sleepy, his arm is going numb. Pt reports ?having hallucinations. His face is swollen as well. ?Pt had infection in his face after tooth extraction. Pt ?taking amoxicillin now. Hot and cold at the same time. ?Symptoms started today. First dose taken Monday ?night. ?Does the patient have any new or worsening ?symptoms? ---Yes ?Will a triage be completed? ---Yes ?Related visit to physician within the last 2 weeks? ---No ?Does the PT have any chronic conditions? (i.e. ?diabetes, asthma, this includes High risk factors for ?pregnancy, etc.) ?---Yes ?List chronic conditions. ---HSV1 ?Is this a behavioral health or substance abuse call? ---No ?Guidelines ?Guideline Title Affirmed Question Affirmed Notes Nurse Date/Time  (Eastern ?Time) ?Neurologic Deficit [1] Numbness (i.e., ?loss of sensation) of ?the face, arm / hand, ?or leg / foot on one ?Dunston, RN, ?[place] ?04/06/2022 3:44:54 PM ?PLEASE NOTE: All timestamps contained within this report are represented as [place] Standard Time. ?CONFIDENTIALTY NOTICE: This fax transmission is intended only for the addressee. It contains information that is legally privileged, confidential or ?otherwise protected from use or disclosure. If you are not the intended recipient, you are strictly prohibited from reviewing, disclosing, copying using ?or disseminating any of this information or taking any action in reliance on or regarding this information. If you have received this fax in error, please ?notify us immediately by telephone so that we can arrange for its return to [place]. Phone: [phone], Toll-Free: [phone], Fax: [phone] ?Page: 2 of 2 ?Call Id: [id number] ?Guidelines ?Guideline Title Affirmed Question Affirmed Notes Nurse Date/Time (Eastern ?Time) ?side of the body AND ?[2] sudden onset ?AND [3] brief (now ?gone) ?Disp. Time (Eastern ?Time) Disposition Final User ?04/06/2022 3:22:44 PM Attempt made - message left Dunston, RN, [place] ?04/06/2022 3:47:34 PM Go to ED Now (or PCP triage) Yes Dunston, RN, [place] ?Caller Disagree/Comply Comply ?Caller Understands Yes ?PreDisposition InappropriateToAsk ?Care Advice Given Per Guideline ?* IF NO PCP (PRIMARY CARE PROVIDER) SECOND-LEVEL TRIAGE: You need to be seen within the next hour. Go to ?the ED/UCC at _____________ Hospital. Leave as soon as you can. * It is better and safer if another adult drives instead of you. ?ANOTHER ADULT SHOULD DRIVE: CARE ADVICE given per Neurologic Deficit (Adult) guideline. ?Comments ?User: Earney Navy, RN Date/Time Lamount Cohen Time): 04/06/2022 3:42:19 PM ?pt also taking valacyclovir daily, ?User: Earney Navy, RN Date/Time Lamount Cohen Time): 04/06/2022 3:43:53 PM ?pt is taking clindimcin not  amoxicillin ?  User: Earney Navy, RN Date/Time Lamount Cohen Time): 04/06/2022 3:44:27 PM ?Temp 97 (oral) ?Referrals ?GO TO FACILITY UNDECIDED ?[facility] - E ?

## 2022-04-06 NOTE — ED Provider Notes (Signed)
?[facility] ? ? ? ?CSN: [id number] ?Arrival date & time: 04/06/22  1732 ? ? ?  ? ?History   ?Chief Complaint ?Chief Complaint  ?Patient presents with  ? Dental Pain  ? ? ?HPI ?Dakota Stanley is a 41 y.o. male.  ? ?Patient presents with left facial swelling and pain.  Reports he had his wisdom teeth taken out about 1 week ago.  He noticed increase in swelling, fever, pus taste earlier this week and went to the emergency room where he had a CAT scan that showed infection in his jaw.  He was started on clindamycin and has been taking this as prescribed.  He reports this morning, he woke up with right arm numbness after laying on his arm.  He reports his arm still feels like it is asleep and it is tingly.  He also reports he is worried that the clindamycin is causing this reaction and that it is not working for the tooth pain.  He endorses fever and some nausea, however no vomiting.  He is eating soft foods and tolerating them well. ? ? ?Past Medical History:  ?Diagnosis Date  ? HSV-1 (herpes simplex virus 1) infection   ? Inguinal hernia   ? Legally blind   ? Meningitis   ? 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 ? ?Patient Active Problem List  ? Diagnosis Date Noted  ? External hemorrhoids 03/01/2022  ? Muscle spasm of back 02/22/2022  ? Hyperlipidemia 12/22/2021  ? Erectile dysfunction 12/22/2021  ? HSV-1 infection 01/02/2021  ? Legally blind 07/15/2018  ? ? ?Past Surgical History:  ?Procedure Laterality Date  ? BACK SURGERY    ? EYE SURGERY    ? INGUINAL HERNIA REPAIR    ? WISDOM TOOTH EXTRACTION Bilateral   ? ? ? ? ? ?Home Medications   ? ?Prior to Admission medications   ?Medication Sig Start Date End Date Taking? Authorizing Provider  ?Carboxymethylcellulose Sodium (ARTIFICIAL TEARS OP) Apply 2 drops to eye daily as needed.    [provider]  ?clindamycin (CLEOCIN) 150 MG capsule SMARTSIG:3 By Mouth 3 Times Daily 04/02/22   [provider]  ?cyclobenzaprine (FLEXERIL) 10 MG  tablet Take 1 tablet (10 mg total) by mouth 3 (three) times daily as needed for muscle spasms. 02/22/22   Mort Sawyers, FNP  ?diazepam (VALIUM) 10 MG tablet 1 tab po 30 min prior to procedure 01/04/22   Riki Altes, MD  ?Hydrocortisone, Perianal, (PROCTO-PAK) 1 % CREA apply twice daily to rectum up to two weeks prn 03/01/22   Mort Sawyers, FNP  ?Multiple Vitamins-Minerals (MULTIVITAMIN WITH MINERALS) tablet Take 1 tablet by mouth daily.    [provider]  ?valACYclovir (VALTREX) 1000 MG tablet Take 1 tablet (1,000 mg total) by mouth daily. 02/22/22   Mort Sawyers, FNP  ? ? ?Family History ?Family History  ?Problem Relation Age of Onset  ? Hypertension Mother   ? Cerebral aneurysm Father   ? Healthy Sister   ? Healthy Brother   ? Healthy Brother   ? Heart disease Paternal Grandmother   ? ? ?Social History ?Social History  ? ?Tobacco Use  ? Smoking status: Former  ?  Types: E-cigarettes  ? Smokeless tobacco: Never  ? Tobacco comments:  ?  Single cartridge over with THC  ?Vaping Use  ? Vaping Use: Former  ? Substances: THC  ?Substance Use Topics  ? Alcohol use: Yes  ?  Comment: three times a week, crown  royal with coke or beer  ? Drug use: Yes  ?  Types: Marijuana  ?  Comment: three hits three times a week  ? ? ? ?Allergies   ?Shellfish allergy and Morphine and related ? ? ?Review of Systems ?Review of Systems ?Per HPI ? ?Physical Exam ?Triage Vital Signs ?ED Triage Vitals [04/06/22 1832]  ?Enc Vitals Group  ?   BP 118/84  ?   Pulse Rate 87  ?   Resp 17  ?   Temp 98.9 ?F (37.2 ?C)  ?   Temp Source Oral  ?   SpO2 96 %  ?   Weight   ?   Height   ?   Head Circumference   ?   Peak Flow   ?   Pain Score 10  ?   Pain Loc   ?   Pain Edu?   ?   Excl. in GC?   ? ?No data found. ? ?Updated Vital Signs ?BP 118/84 (BP Location: Right Arm)   Pulse 87   Temp 98.9 ?F (37.2 ?C) (Oral)   Resp 17   SpO2 96%  ? ?Visual Acuity ?Right Eye Distance:   ?Left Eye Distance:   ?Bilateral Distance:   ? ?Right Eye Near:    ?Left Eye Near:    ?Bilateral Near:    ? ?Physical Exam ?Vitals and nursing note reviewed.  ?Constitutional:   ?   General: He is not in acute distress. ?   Appearance: Normal appearance. He is not toxic-appearing.  ?HENT:  ?   Head: Normocephalic and atraumatic.  ?   Jaw: Tenderness, swelling and pain on movement present.  ? ?   Comments: Tender to palpation in the area marked, there is significant swelling.  ?   Mouth/Throat:  ?   Dentition: Abnormal dentition. Dental tenderness and dental caries present.  ?   Pharynx: Oropharynx is clear. No posterior oropharyngeal erythema.  ?Pulmonary:  ?   Effort: Pulmonary effort is normal. No respiratory distress.  ?Musculoskeletal:  ?   Right upper arm: Normal. No swelling, deformity, tenderness or bony tenderness.  ?   Left upper arm: Normal. No swelling, deformity, tenderness or bony tenderness.  ?   Right wrist: Normal pulse.  ?   Left wrist: Normal pulse.  ?   Right hand: Normal strength. Normal sensation. Normal capillary refill. Normal pulse.  ?   Left hand: Normal strength. Normal sensation. Normal capillary refill. Normal pulse.  ?Skin: ?   General: Skin is warm and dry.  ?   Capillary Refill: Capillary refill takes less than 2 seconds.  ?   Coloration: Skin is not jaundiced or pale.  ?Neurological:  ?   Mental Status: He is alert and oriented to person, place, and time.  ?   Cranial Nerves: No cranial nerve deficit.  ?   Sensory: Sensation is intact.  ?   Motor: No weakness.  ?   Gait: Gait normal.  ?Psychiatric:     ?   Behavior: Behavior is cooperative.  ? ? ? ?UC Treatments / Results  ?Labs ?(all labs ordered are listed, but only abnormal results are displayed) ?Labs Reviewed - No data to display ? ?EKG ? ? ?Radiology ?No results found. ? ?Procedures ?Procedures (including critical care time) ? ?Medications Ordered in UC ?Medications - No data to display ? ?Initial Impression / Assessment and Plan / UC Course  ?I have reviewed the triage vital signs and the  nursing notes. ? ?  Pertinent labs & imaging results that were available during my care of the patient were reviewed by me and considered in my medical decision making (see chart for details). ? ?  ?I am concerned that the patient is not responding appropriately to antibiotics for the dental infection.  I am also concerned about the new onset of arm numbness/tingling.  I recommended patient go to the ER for further evaluation and management, however he declined and said he is not going to do that.   ?Final Clinical Impressions(s) / UC Diagnoses  ? ?Final diagnoses:  ?Pain, dental  ?Facial swelling  ? ? ? ?Discharge Instructions   ? ?  ?- I recommend you go to the Emergency Room for further evaluation ? ? ? ?ED Prescriptions   ?None ?  ? ?PDMP not reviewed this encounter. ?  ?Valentino Nose, NP ?04/06/22 2118 ? ?

## 2022-04-06 NOTE — Telephone Encounter (Signed)
Pt said he is not going to sit 6 hrs in ED and is feeling some better now; advised with his symptoms he needs to be seen. Pt said he is not going to ED but will go to [facility]. Pt said he thinks he just got upset but will go to be eval due to symptoms listed in access note. Sending note to T Dugal FNP and Tresa Endo CMA and will teams [place]. ?

## 2022-04-09 NOTE — Telephone Encounter (Signed)
Agree with precautions given to pt  Agree with nurse assessment in plan.  Thank you for speaking with them. 

## 2022-08-08 DIAGNOSIS — J0100 Acute maxillary sinusitis, unspecified: Secondary | ICD-10-CM | POA: Diagnosis not present

## 2022-08-08 DIAGNOSIS — J029 Acute pharyngitis, unspecified: Secondary | ICD-10-CM | POA: Diagnosis not present

## 2022-09-20 ENCOUNTER — Telehealth: Payer: Self-pay | Admitting: Family

## 2022-09-20 NOTE — Telephone Encounter (Signed)
Left message for patient to call back and schedule Medicare Annual Wellness Visit (AWV) either virtually or phone   Left  my jabber number [phone]   Last AWV   03/02/21    45 min for awv-i and in office appointments 30 min for awv-s  phone/virtual appointments

## 2022-09-24 ENCOUNTER — Ambulatory Visit (INDEPENDENT_AMBULATORY_CARE_PROVIDER_SITE_OTHER): Payer: Medicare HMO

## 2022-09-24 VITALS — Ht 72.0 in | Wt 180.0 lb

## 2022-09-24 DIAGNOSIS — Z0000 Encounter for general adult medical examination without abnormal findings: Secondary | ICD-10-CM

## 2022-09-24 NOTE — Patient Instructions (Addendum)
Mr. Blackerby , Thank you for taking time to come for your Medicare Wellness Visit. I appreciate your ongoing commitment to your health goals. Please review the following plan we discussed and let me know if I can assist you in the future.   These are the goals we discussed:  Goals       No current goals (pt-stated)        This is a list of the screening recommended for you and due dates:  Health Maintenance  Topic Date Due   COVID-19 Vaccine (3 - Pfizer risk series) 10/10/2022*   Flu Shot  03/03/2023*   Tetanus Vaccine  08/09/2025   Hepatitis C Screening: USPSTF Recommendation to screen - Ages 18-79 yo.  Completed   HIV Screening  Completed   HPV Vaccine  Aged Out  *Topic was postponed. The date shown is not the original due date.    Advanced directives: Advance directive discussed with you today. Even though you declined this today, please call our office should you change your mind, and we can give you the proper paperwork for you to fill out.   Conditions/risks identified: None  Next appointment: Follow up in one year for your annual wellness visit    Preventive Care 40-64 Years, Male Preventive care refers to lifestyle choices and visits with your health care provider that can promote health and wellness. What does preventive care include? A yearly physical exam. This is also called an annual well check. Dental exams once or twice a year. Routine eye exams. Ask your health care provider how often you should have your eyes checked. Personal lifestyle choices, including: Daily care of your teeth and gums. Regular physical activity. Eating a healthy diet. Avoiding tobacco and drug use. Limiting alcohol use. Practicing safe sex. Taking low-dose aspirin every day starting at age 59. What happens during an annual well check? The services and screenings done by your health care provider during your annual well check will depend on your age, overall health, lifestyle risk  factors, and family history of disease. Counseling  Your health care provider may ask you questions about your: Alcohol use. Tobacco use. Drug use. Emotional well-being. Home and relationship well-being. Sexual activity. Eating habits. Work and work Astronomer. Screening  You may have the following tests or measurements: Height, weight, and BMI. Blood pressure. Lipid and cholesterol levels. These may be checked every 5 years, or more frequently if you are over 52 years old. Skin check. Lung cancer screening. You may have this screening every year starting at age 3 if you have a 30-pack-year history of smoking and currently smoke or have quit within the past 15 years. Fecal occult blood test (FOBT) of the stool. You may have this test every year starting at age 81. Flexible sigmoidoscopy or colonoscopy. You may have a sigmoidoscopy every 5 years or a colonoscopy every 10 years starting at age 40. Prostate cancer screening. Recommendations will vary depending on your family history and other risks. Hepatitis C blood test. Hepatitis B blood test. Sexually transmitted disease (STD) testing. Diabetes screening. This is done by checking your blood sugar (glucose) after you have not eaten for a while (fasting). You may have this done every 1-3 years. Discuss your test results, treatment options, and if necessary, the need for more tests with your health care provider. Vaccines  Your health care provider may recommend certain vaccines, such as: Influenza vaccine. This is recommended every year. Tetanus, diphtheria, and acellular pertussis (Tdap, Td) vaccine. You  may need a Td booster every 10 years. Zoster vaccine. You may need this after age 37. Pneumococcal 13-valent conjugate (PCV13) vaccine. You may need this if you have certain conditions and have not been vaccinated. Pneumococcal polysaccharide (PPSV23) vaccine. You may need one or two doses if you smoke cigarettes or if you have  certain conditions. Talk to your health care provider about which screenings and vaccines you need and how often you need them. This information is not intended to replace advice given to you by your health care provider. Make sure you discuss any questions you have with your health care provider. Document Released: 12/16/2015 Document Revised: 08/08/2016 Document Reviewed: 09/20/2015 Elsevier Interactive Patient Education  2017 [place] Prevention in the Home Falls can cause injuries. They can happen to people of all ages. There are many things you can do to make your home safe and to help prevent falls. What can I do on the outside of my home? Regularly fix the edges of walkways and driveways and fix any cracks. Remove anything that might make you trip as you walk through a door, such as a raised step or threshold. Trim any bushes or trees on the path to your home. Use bright outdoor lighting. Clear any walking paths of anything that might make someone trip, such as rocks or tools. Regularly check to see if handrails are loose or broken. Make sure that both sides of any steps have handrails. Any raised decks and porches should have guardrails on the edges. Have any leaves, snow, or ice cleared regularly. Use sand or salt on walking paths during winter. Clean up any spills in your garage right away. This includes oil or grease spills. What can I do in the bathroom? Use night lights. Install grab bars by the toilet and in the tub and shower. Do not use towel bars as grab bars. Use non-skid mats or decals in the tub or shower. If you need to sit down in the shower, use a plastic, non-slip stool. Keep the floor dry. Clean up any water that spills on the floor as soon as it happens. Remove soap buildup in the tub or shower regularly. Attach bath mats securely with double-sided non-slip rug tape. Do not have throw rugs and other things on the floor that can make you trip. What can  I do in the bedroom? Use night lights. Make sure that you have a light by your bed that is easy to reach. Do not use any sheets or blankets that are too big for your bed. They should not hang down onto the floor. Have a firm chair that has side arms. You can use this for support while you get dressed. Do not have throw rugs and other things on the floor that can make you trip. What can I do in the kitchen? Clean up any spills right away. Avoid walking on wet floors. Keep items that you use a lot in easy-to-reach places. If you need to reach something above you, use a strong step stool that has a grab bar. Keep electrical cords out of the way. Do not use floor polish or wax that makes floors slippery. If you must use wax, use non-skid floor wax. Do not have throw rugs and other things on the floor that can make you trip. What can I do with my stairs? Do not leave any items on the stairs. Make sure that there are handrails on both sides of the stairs and use  them. Fix handrails that are broken or loose. Make sure that handrails are as long as the stairways. Check any carpeting to make sure that it is firmly attached to the stairs. Fix any carpet that is loose or worn. Avoid having throw rugs at the top or bottom of the stairs. If you do have throw rugs, attach them to the floor with carpet tape. Make sure that you have a light switch at the top of the stairs and the bottom of the stairs. If you do not have them, ask someone to add them for you. What else can I do to help prevent falls? Wear shoes that: Do not have high heels. Have rubber bottoms. Are comfortable and fit you well. Are closed at the toe. Do not wear sandals. If you use a stepladder: Make sure that it is fully opened. Do not climb a closed stepladder. Make sure that both sides of the stepladder are locked into place. Ask someone to hold it for you, if possible. Clearly mark and make sure that you can see: Any grab bars or  handrails. First and last steps. Where the edge of each step is. Use tools that help you move around (mobility aids) if they are needed. These include: Canes. Walkers. Scooters. Crutches. Turn on the lights when you go into a dark area. Replace any light bulbs as soon as they burn out. Set up your furniture so you have a clear path. Avoid moving your furniture around. If any of your floors are uneven, fix them. If there are any pets around you, be aware of where they are. Review your medicines with your doctor. Some medicines can make you feel dizzy. This can increase your chance of falling. Ask your doctor what other things that you can do to help prevent falls. This information is not intended to replace advice given to you by your health care provider. Make sure you discuss any questions you have with your health care provider. Document Released: 09/15/2009 Document Revised: 04/26/2016 Document Reviewed: 12/24/2014 Elsevier Interactive Patient Education  2017 [employer].

## 2022-09-24 NOTE — Progress Notes (Signed)
Subjective:   Dakota Stanley is a 41 y.o. male who presents for Medicare Annual/Subsequent preventive examination.  Review of Systems    Virtual Visit via Telephone Note  I connected with  Dakota Stanley on 09/24/22 at  8:45 AM EDT by telephone and verified that I am speaking with the correct person using two identifiers.  Location: Patient: Home  Provider: Office Persons participating in the virtual visit: patient/Nurse Health Advisor   I discussed the limitations, risks, security and privacy concerns of performing an evaluation and management service by telephone and the availability of in person appointments. The patient expressed understanding and agreed to proceed.  Interactive audio and video telecommunications were attempted between this nurse and patient, however failed, due to patient having technical difficulties OR patient did not have access to video capability.  We continued and completed visit with audio only.  Some vital signs may be absent or patient reported.   Criselda Peaches, LPN  Cardiac Risk Factors include: advanced age (>14men, >89 women)     Objective:    Today's Vitals   09/24/22 0857  Weight: 180 lb (81.6 kg)  Height: 6' (1.829 m)   Body mass index is 24.41 kg/m.     09/24/2022    9:06 AM  Advanced Directives  Does Patient Have a Medical Advance Directive? No  Would patient like information on creating a medical advance directive? No - Patient declined    Current Medications (verified) Outpatient Encounter Medications as of 09/24/2022  Medication Sig   Carboxymethylcellulose Sodium (ARTIFICIAL TEARS OP) Apply 2 drops to eye daily as needed.   clindamycin (CLEOCIN) 150 MG capsule SMARTSIG:3 By Mouth 3 Times Daily   cyclobenzaprine (FLEXERIL) 10 MG tablet Take 1 tablet (10 mg total) by mouth 3 (three) times daily as needed for muscle spasms.   diazepam (VALIUM) 10 MG tablet 1 tab po 30 min prior to procedure   Hydrocortisone, Perianal,  (PROCTO-PAK) 1 % CREA apply twice daily to rectum up to two weeks prn   Multiple Vitamins-Minerals (MULTIVITAMIN WITH MINERALS) tablet Take 1 tablet by mouth daily.   valACYclovir (VALTREX) 1000 MG tablet Take 1 tablet (1,000 mg total) by mouth daily.   No facility-administered encounter medications on file as of 09/24/2022.    Allergies (verified) Shellfish allergy and Morphine and related   History: Past Medical History:  Diagnosis Date   HSV-1 (herpes simplex virus 1) infection    Inguinal hernia    Legally blind    Meningitis    Total retinal detachment of right eye 04/05/2017   Past Surgical History:  Procedure Laterality Date   BACK SURGERY     EYE SURGERY     INGUINAL HERNIA REPAIR     WISDOM TOOTH EXTRACTION Bilateral    Family History  Problem Relation Age of Onset   Hypertension Mother    Cerebral aneurysm Father    Healthy Sister    Healthy Brother    Healthy Brother    Heart disease Paternal Grandmother    Social History   Socioeconomic History   Marital status: Single    Spouse name: Not on file   Number of children: Not on file   Years of education: Not on file   Highest education level: Not on file  Occupational History   Occupation: unemployed - disabled  Tobacco Use   Smoking status: Former    Types: E-cigarettes   Smokeless tobacco: Never   Tobacco comments:    Single cartridge  over with THC  Vaping Use   Vaping Use: Former   Substances: THC  Substance and Sexual Activity   Alcohol use: Yes    Comment: three times a week, crown royal with coke or beer   Drug use: Yes    Types: Marijuana    Comment: three hits three times a week   Sexual activity: Yes    Partners: Female    Comment: new partner in the last few months  Other Topics Concern   Not on file  Social History Narrative   Not on file   Social Determinants of Health   Financial Resource Strain: Low Risk  (09/24/2022)   Overall Financial Resource Strain (CARDIA)     Difficulty of Paying Living Expenses: Not hard at all  Food Insecurity: No Food Insecurity (09/24/2022)   Hunger Vital Sign    Worried About Running Out of Food in the Last Year: Never true    Ran Out of Food in the Last Year: Never true  Transportation Needs: No Transportation Needs (09/24/2022)   PRAPARE - Administrator, Civil Service (Medical): No    Lack of Transportation (Non-Medical): No  Physical Activity: Insufficiently Active (09/24/2022)   Exercise Vital Sign    Days of Exercise per Week: 3 days    Minutes of Exercise per Session: 30 min  Stress: No Stress Concern Present (09/24/2022)   [employer] of Occupational Health - Occupational Stress Questionnaire    Feeling of Stress : Not at all  Social Connections: Moderately Integrated (09/24/2022)   Social Connection and Isolation Panel [NHANES]    Frequency of Communication with Friends and Family: More than three times a week    Frequency of Social Gatherings with Friends and Family: More than three times a week    Attends Religious Services: More than 4 times per year    Active Member of [employer] or Organizations: Yes    Attends Engineer, structural: More than 4 times per year    Marital Status: Never married    Tobacco Counseling Counseling given: Not Answered Tobacco comments: Single cartridge over with THC   Clinical Intake:  Pre-visit preparation completed: No  Pain : No/denies pain     BMI - recorded: 24.41 Nutritional Status: BMI of 19-24  Normal Nutritional Risks: None Diabetes: No  How often do you need to have someone help you when you read instructions, pamphlets, or other written materials from your doctor or pharmacy?: 3 - Sometimes (Daughter assist)  Diabetic?  No  Interpreter Needed?: No  Information entered by :: Theresa Mulligan LPN   Activities of Daily Living    09/24/2022    9:05 AM  In your present state of health, do you have any difficulty performing the  following activities:  Hearing? 0  Vision? 0  Difficulty concentrating or making decisions? 0  Walking or climbing stairs? 0  Dressing or bathing? 0  Doing errands, shopping? 0  Preparing Food and eating ? N  Using the Toilet? N  In the past six months, have you accidently leaked urine? N  Do you have problems with loss of bowel control? N  Managing your Medications? N  Managing your Finances? N  Housekeeping or managing your Housekeeping? N    Patient Care Team: Mort Sawyers, FNP as PCP - General (Family Medicine)  Indicate any recent Medical Services you may have received from other than [facility] providers in the past year (date may be approximate).  Assessment:   This is a routine wellness examination for Dakota Stanley.  Hearing/Vision screen Hearing Screening - Comments:: Denies hearing difficulties   Vision Screening - Comments:: Wears rx glasses - up to date with routine eye exams with  [facility]  Dietary issues and exercise activities discussed: Current Exercise Habits: Home exercise routine, Type of exercise: walking, Time (Minutes): 30, Frequency (Times/Week): 3, Weekly Exercise (Minutes/Week): 90, Intensity: Moderate, Exercise limited by: None identified   Goals Addressed               This Visit's Progress     No current goals (pt-stated)         Depression Screen    09/24/2022    9:03 AM 03/02/2021    9:23 AM 02/05/2020   10:39 AM 01/15/2019    8:57 AM 07/15/2018   10:18 AM  PHQ 2/9 Scores  PHQ - 2 Score 0 0 0 0 0    Fall Risk    09/24/2022    9:05 AM 02/05/2020   10:38 AM 07/15/2018   10:18 AM  Fall Risk   Falls in the past year? 0 0 No  Number falls in past yr: 0 0   Injury with Fall? 0 0   Risk for fall due to : No Fall Risks    Follow up Falls prevention discussed      FALL RISK PREVENTION PERTAINING TO THE HOME:  Any stairs in or around the home? Yes  If so, are there any without handrails? No  Home free of loose throw rugs in  walkways, pet beds, electrical cords, etc? No  Adequate lighting in your home to reduce risk of falls? Yes   ASSISTIVE DEVICES UTILIZED TO PREVENT FALLS:  Life alert? No  Use of a cane, walker or w/c? No  Grab bars in the bathroom? No  Shower chair or bench in shower? No  Elevated toilet seat or a handicapped toilet? No   TIMED UP AND GO:  Was the test performed? No . Audio Visit   Cognitive Function:        09/24/2022    9:06 AM  6CIT Screen  What Year? 0 points  What month? 0 points  What time? 0 points  Count back from 20 0 points  Months in reverse 4 points  Repeat phrase 0 points  Total Score 4 points    Immunizations Immunization History  Administered Date(s) Administered   Influenza, Seasonal, Injecte, Preservative Fre 11/08/2017   Influenza,Quad,Nasal, Live 08/10/2015   Influenza,inj,Quad PF,6+ Mos 10/12/2016, 01/15/2019   PFIZER(Purple Top)SARS-COV-2 Vaccination 05/12/2020, 06/02/2020   Tdap 08/10/2015    TDAP status: Up to date  Flu Vaccine status: Due, Education has been provided regarding the importance of this vaccine. Advised may receive this vaccine at local pharmacy or Health Dept. Aware to provide a copy of the vaccination record if obtained from local pharmacy or Health Dept. Verbalized acceptance and understanding.    Covid-19 vaccine status: Completed vaccines  Qualifies for Shingles Vaccine? No   Zostavax completed No   Shingrix Completed?: No.    Education has been provided regarding the importance of this vaccine. Patient has been advised to call insurance company to determine out of pocket expense if they have not yet received this vaccine. Advised may also receive vaccine at local pharmacy or Health Dept. Verbalized acceptance and understanding.  Screening Tests Health Maintenance  Topic Date Due   COVID-19 Vaccine (3 - Pfizer risk series) 10/10/2022 (Originally 06/30/2020)  INFLUENZA VACCINE  03/03/2023 (Originally 07/03/2022)    TETANUS/TDAP  08/09/2025   Hepatitis C Screening  Completed   HIV Screening  Completed   HPV VACCINES  Aged Out    Health Maintenance  There are no preventive care reminders to display for this patient.     Lung Cancer Screening: (Low Dose CT Chest recommended if Age 60-80 years, 30 pack-year currently smoking OR have quit w/in 15years.) does not qualify.     Additional Screening:  Hepatitis C Screening: does qualify; Completed 12/22/21  Vision Screening: Recommended annual ophthalmology exams for early detection of glaucoma and other disorders of the eye. Is the patient up to date with their annual eye exam?  Yes Who is the provider or what is the name of the office in which the patient attends annual eye exams? [facility] If pt is not established with a provider, would they like to be referred to a provider to establish care? No .   Dental Screening: Recommended annual dental exams for proper oral hygiene  Community Resource Referral / Chronic Care Management:  CRR required this visit?  No   CCM required this visit?  No      Plan:     I have personally reviewed and noted the following in the patient's chart:   Medical and social history Use of alcohol, tobacco or illicit drugs  Current medications and supplements including opioid prescriptions. Patient is not currently taking opioid prescriptions. Functional ability and status Nutritional status Physical activity Advanced directives List of other physicians Hospitalizations, surgeries, and ER visits in previous 12 months Vitals Screenings to include cognitive, depression, and falls Referrals and appointments  In addition, I have reviewed and discussed with patient certain preventive protocols, quality metrics, and best practice recommendations. A written personalized care plan for preventive services as well as general preventive health recommendations were provided to patient.     Tillie Rung,  LPN   22/29/[id number]   Nurse Notes: None

## 2022-11-13 DIAGNOSIS — U071 COVID-19: Secondary | ICD-10-CM | POA: Diagnosis not present

## 2022-12-19 DIAGNOSIS — Z0100 Encounter for examination of eyes and vision without abnormal findings: Secondary | ICD-10-CM | POA: Diagnosis not present

## 2022-12-24 DIAGNOSIS — H27111 Subluxation of lens, right eye: Secondary | ICD-10-CM | POA: Diagnosis not present

## 2022-12-24 DIAGNOSIS — H209 Unspecified iridocyclitis: Secondary | ICD-10-CM | POA: Diagnosis not present

## 2022-12-24 DIAGNOSIS — H30132 Disseminated chorioretinal inflammation, generalized, left eye: Secondary | ICD-10-CM | POA: Diagnosis not present

## 2022-12-28 ENCOUNTER — Encounter: Payer: Medicare HMO | Admitting: Family

## 2022-12-31 ENCOUNTER — Ambulatory Visit (INDEPENDENT_AMBULATORY_CARE_PROVIDER_SITE_OTHER): Payer: Medicare HMO | Admitting: Family

## 2022-12-31 ENCOUNTER — Encounter: Payer: Self-pay | Admitting: Family

## 2022-12-31 VITALS — BP 114/72 | HR 94 | Temp 98.6°F | Ht 72.0 in | Wt 189.8 lb

## 2022-12-31 DIAGNOSIS — D72829 Elevated white blood cell count, unspecified: Secondary | ICD-10-CM

## 2022-12-31 DIAGNOSIS — E559 Vitamin D deficiency, unspecified: Secondary | ICD-10-CM

## 2022-12-31 DIAGNOSIS — E7800 Pure hypercholesterolemia, unspecified: Secondary | ICD-10-CM | POA: Diagnosis not present

## 2022-12-31 DIAGNOSIS — Z6825 Body mass index (BMI) 25.0-25.9, adult: Secondary | ICD-10-CM

## 2022-12-31 DIAGNOSIS — Z0000 Encounter for general adult medical examination without abnormal findings: Secondary | ICD-10-CM | POA: Insufficient documentation

## 2022-12-31 DIAGNOSIS — N529 Male erectile dysfunction, unspecified: Secondary | ICD-10-CM | POA: Diagnosis not present

## 2022-12-31 DIAGNOSIS — E871 Hypo-osmolality and hyponatremia: Secondary | ICD-10-CM | POA: Diagnosis not present

## 2022-12-31 DIAGNOSIS — E663 Overweight: Secondary | ICD-10-CM

## 2022-12-31 DIAGNOSIS — B009 Herpesviral infection, unspecified: Secondary | ICD-10-CM

## 2022-12-31 DIAGNOSIS — K644 Residual hemorrhoidal skin tags: Secondary | ICD-10-CM | POA: Diagnosis not present

## 2022-12-31 DIAGNOSIS — Z79899 Other long term (current) drug therapy: Secondary | ICD-10-CM | POA: Diagnosis not present

## 2022-12-31 LAB — CBC
HCT: 44.6 % (ref 39.0–52.0)
Hemoglobin: 15 g/dL (ref 13.0–17.0)
MCHC: 33.7 g/dL (ref 30.0–36.0)
MCV: 89.8 fl (ref 78.0–100.0)
Platelets: 286 10*3/uL (ref 150.0–400.0)
RBC: 4.97 Mil/uL (ref 4.22–5.81)
RDW: 14 % (ref 11.5–15.5)
WBC: 4.2 10*3/uL (ref 4.0–10.5)

## 2022-12-31 LAB — COMPREHENSIVE METABOLIC PANEL
ALT: 14 U/L (ref 0–53)
AST: 12 U/L (ref 0–37)
Albumin: 4.9 g/dL (ref 3.5–5.2)
Alkaline Phosphatase: 48 U/L (ref 39–117)
BUN: 21 mg/dL (ref 6–23)
CO2: 29 mEq/L (ref 19–32)
Calcium: 10 mg/dL (ref 8.4–10.5)
Chloride: 102 mEq/L (ref 96–112)
Creatinine, Ser: 1.28 mg/dL (ref 0.40–1.50)
GFR: 68.63 mL/min (ref >60.00)
Glucose, Bld: 88 mg/dL (ref 70–99)
Potassium: 4.6 mEq/L (ref 3.5–5.1)
Sodium: 140 mEq/L (ref 135–145)
Total Bilirubin: 1 mg/dL (ref 0.2–1.2)
Total Protein: 7.4 g/dL (ref 6.0–8.3)

## 2022-12-31 LAB — VITAMIN D 25 HYDROXY (VIT D DEFICIENCY, FRACTURES): VITD: 25.53 ng/mL — ABNORMAL LOW (ref 30.00–100.00)

## 2022-12-31 LAB — TESTOSTERONE: Testosterone: 195.76 ng/dL — ABNORMAL LOW (ref 300.00–890.00)

## 2022-12-31 MED ORDER — TADALAFIL 10 MG PO TABS: 10.0000 mg | ORAL_TABLET | ORAL | 1 refills | Status: DC | PRN

## 2022-12-31 NOTE — Assessment & Plan Note (Signed)
Pt advised to work on diet and exercise as tolerated  

## 2022-12-31 NOTE — Assessment & Plan Note (Signed)
Stable with valtrex 1 g daily for suppression

## 2022-12-31 NOTE — Assessment & Plan Note (Signed)
Ordered vitamin d pending results.  Continue otc supplementation

## 2022-12-31 NOTE — Assessment & Plan Note (Signed)
Ongoing Repeat testosterone, if < range will refer back to urology Trial cialis, sent to pharmacy

## 2022-12-31 NOTE — Progress Notes (Signed)
Established Patient Office Visit  Subjective:      CC:  Chief Complaint  Patient presents with   Annual Exam    HPI: Dakota Stanley is a 42 y.o. male presenting on 12/31/2022 for Annual Exam .   New complaints: Still with ongoing erectile dysfunction. He states can get hard but then hard to maintain erection. He is starting to find this frustrating. Did see urologist  in the past 2023, but testosterone was low but not low enough to get injections.        Social history:  Relevant past medical, surgical, family and social history reviewed and updated as indicated. Interim medical history since our last visit reviewed.  Allergies and medications reviewed and updated.  DATA REVIEWED: CHART IN EPIC     ROS: Negative unless specifically indicated above in HPI.    Current Outpatient Medications:    Multiple Vitamins-Minerals (MULTIVITAMIN WITH MINERALS) tablet, Take 1 tablet by mouth daily., Disp: , Rfl:    tadalafil (CIALIS) 10 MG tablet, Take 1 tablet (10 mg total) by mouth every other day as needed for erectile dysfunction., Disp: 10 tablet, Rfl: 1   valACYclovir (VALTREX) 1000 MG tablet, Take 1 tablet (1,000 mg total) by mouth daily., Disp: 90 tablet, Rfl: 3      Objective:    BP 114/72   Pulse 94   Temp 98.6 F (37 C) (Oral)   Ht 6' (1.829 m)   Wt 189 lb 12.8 oz (86.1 kg)   SpO2 98%   BMI 25.74 kg/m   Wt Readings from Last 3 Encounters:  12/31/22 189 lb 12.8 oz (86.1 kg)  09/24/22 180 lb (81.6 kg)  03/01/22 175 lb 5 oz (79.5 kg)    Physical Exam Vitals reviewed.  Constitutional:      General: He is not in acute distress.    Appearance: Normal appearance. He is normal weight. He is not ill-appearing, toxic-appearing or diaphoretic.  HENT:     Head: Normocephalic.     Right Ear: Tympanic membrane normal.     Left Ear: Tympanic membrane normal.     Nose: Nose normal.     Mouth/Throat:     Mouth: Mucous membranes are moist.  Eyes:     Pupils:  Pupils are equal, round, and reactive to light.  Cardiovascular:     Rate and Rhythm: Normal rate and regular rhythm.  Pulmonary:     Effort: Pulmonary effort is normal.     Breath sounds: Normal breath sounds.  Abdominal:     General: Abdomen is flat.     Tenderness: There is no abdominal tenderness.  Musculoskeletal:        General: Normal range of motion.  Skin:    General: Skin is warm.  Neurological:     General: No focal deficit present.     Mental Status: He is alert and oriented to person, place, and time. Mental status is at baseline.  Psychiatric:        Mood and Affect: Mood normal.        Behavior: Behavior normal.        Thought Content: Thought content normal.        Judgment: Judgment normal.     Physical Exam        Assessment & Plan:  Erectile dysfunction, unspecified erectile dysfunction type Assessment & Plan: Ongoing Repeat testosterone, if < range will refer back to urology Trial cialis, sent to pharmacy   Orders: -  Tadalafil; Take 1 tablet (10 mg total) by mouth every other day as needed for erectile dysfunction.  Dispense: 10 tablet; Refill: 1 -     Testosterone  Low sodium levels -     CBC  Leukocytosis, unspecified type -     CBC  Elevated LDL cholesterol level -     Comprehensive metabolic panel  Vitamin D deficiency Assessment & Plan: Ordered vitamin d pending results.  Continue otc supplementation  Orders: -     VITAMIN D 25 Hydroxy (Vit-D Deficiency, Fractures)  On statin therapy -     Comprehensive metabolic panel  Overweight with body mass index (BMI) of 25 to 25.9 in adult Assessment & Plan: Pt advised to work on diet and exercise as tolerated    HSV-1 infection Assessment & Plan: Stable with valtrex 1 g daily for suppression    External hemorrhoids Assessment & Plan: stable   Encounter for general adult medical examination without abnormal findings Assessment & Plan: Patient Counseling(The following  topics were reviewed):  Preventative care handout given to pt  Health maintenance and immunizations reviewed. Please refer to Health maintenance section. Pt advised on safe sex, wearing seatbelts in car, and proper nutrition labwork ordered today for annual Dental health: Discussed importance of regular tooth brushing, flossing, and dental visits.       No follow-ups on file.  Eugenia Pancoast, MSN, APRN, FNP-C [facility]

## 2022-12-31 NOTE — Assessment & Plan Note (Signed)
Patient Counseling(The following topics were reviewed): ? Preventative care handout given to pt  ?Health maintenance and immunizations reviewed. Please refer to Health maintenance section. ?Pt advised on safe sex, wearing seatbelts in car, and proper nutrition ?labwork ordered today for annual ?Dental health: Discussed importance of regular tooth brushing, flossing, and dental visits. ? ? ?

## 2022-12-31 NOTE — Assessment & Plan Note (Signed)
stable °

## 2023-01-01 ENCOUNTER — Other Ambulatory Visit: Payer: Self-pay | Admitting: Family

## 2023-01-01 DIAGNOSIS — E559 Vitamin D deficiency, unspecified: Secondary | ICD-10-CM

## 2023-01-01 MED ORDER — VITAMIN D (ERGOCALCIFEROL) 1.25 MG (50000 UNIT) PO CAPS: 50000.0000 [IU] | ORAL_CAPSULE | ORAL | 0 refills | Status: DC

## 2023-03-16 ENCOUNTER — Other Ambulatory Visit: Payer: Self-pay | Admitting: Family

## 2023-03-16 DIAGNOSIS — B009 Herpesviral infection, unspecified: Secondary | ICD-10-CM

## 2023-03-18 NOTE — Telephone Encounter (Signed)
LOV- 12/31/22 NV- 09/30/23

## 2023-06-20 ENCOUNTER — Encounter: Payer: Self-pay | Admitting: Internal Medicine

## 2023-06-20 ENCOUNTER — Ambulatory Visit: Payer: Medicare HMO | Admitting: Internal Medicine

## 2023-06-20 VITALS — BP 116/76 | HR 90 | Temp 98.1°F | Ht 70.0 in | Wt 181.0 lb

## 2023-06-20 DIAGNOSIS — H6500 Acute serous otitis media, unspecified ear: Secondary | ICD-10-CM | POA: Insufficient documentation

## 2023-06-20 DIAGNOSIS — H6501 Acute serous otitis media, right ear: Secondary | ICD-10-CM | POA: Diagnosis not present

## 2023-06-20 DIAGNOSIS — H6121 Impacted cerumen, right ear: Secondary | ICD-10-CM | POA: Diagnosis not present

## 2023-06-20 MED ORDER — NEOMYCIN-POLYMYXIN-HC 3.5-10000-1 OT SUSP: 4.0000 [drp] | Freq: Four times a day (QID) | OTIC | 0 refills | Status: DC

## 2023-06-20 NOTE — Assessment & Plan Note (Signed)
Has some vague sinus symptoms but seems to be mostly a cerumen issue Will try lavage to see if that helps

## 2023-06-20 NOTE — Assessment & Plan Note (Signed)
Hearing improved with cerumen out but has some irritation in distal canal Also TM slightly bulging and mild redness Will give cortisporin drops to try If not improving by next week--would give amoxil or augmentin (if systemic symptoms)

## 2023-06-20 NOTE — Progress Notes (Signed)
Subjective:    Patient ID: Dakota Stanley, male    DOB: Apr 10, 1981, 42 y.o.   MRN: [id number]  HPI Here due to right ear pain  Right mostly--but also some left ear symptoms Feels like "an ear bud is in there" Hearing decreased on right  No swimming  Current Outpatient Medications on File Prior to Visit  Medication Sig Dispense Refill   Multiple Vitamins-Minerals (MULTIVITAMIN WITH MINERALS) tablet Take 1 tablet by mouth daily.     valACYclovir (VALTREX) 1000 MG tablet TAKE 1 TABLET BY MOUTH EVERY DAY 90 tablet 3   No current facility-administered medications on file prior to visit.    Allergies  Allergen Reactions   Morphine And Codeine Hives, Itching and Rash   Shellfish Allergy Other (See Comments), Shortness Of Breath and Swelling    Tongue swelling  Other reaction(s): Angioedema / Facial Swelling Swelling tongue     Past Medical History:  Diagnosis Date   HSV-1 (herpes simplex virus 1) infection    Inguinal hernia    Legally blind    Meningitis    Total retinal detachment of right eye 04/05/2017    Past Surgical History:  Procedure Laterality Date   BACK SURGERY     EYE SURGERY     INGUINAL HERNIA REPAIR     WISDOM TOOTH EXTRACTION Bilateral     Family History  Problem Relation Age of Onset   Hypertension Mother    Cerebral aneurysm Father    Healthy Sister    Healthy Brother    Healthy Brother    Heart disease Paternal Grandmother     Social History   Socioeconomic History   Marital status: Single    Spouse name: Not on file   Number of children: Not on file   Years of education: Not on file   Highest education level: Not on file  Occupational History   Occupation: unemployed - disabled  Tobacco Use   Smoking status: Former    Types: E-cigarettes   Smokeless tobacco: Never   Tobacco comments:    Single cartridge over with THC  Vaping Use   Vaping status: Former   Substances: THC  Substance and Sexual Activity   Alcohol use: Yes     Comment: three times a week, crown royal with coke or beer   Drug use: Yes    Types: Marijuana    Comment: three hits three times a week   Sexual activity: Yes    Partners: Female    Comment: new partner in the last few months  Other Topics Concern   Not on file  Social History Narrative   Not on file   Social Determinants of Health   Financial Resource Strain: Low Risk  (09/24/2022)   Overall Financial Resource Strain (CARDIA)    Difficulty of Paying Living Expenses: Not hard at all  Food Insecurity: No Food Insecurity (09/24/2022)   Hunger Vital Sign    Worried About Running Out of Food in the Last Year: Never true    Ran Out of Food in the Last Year: Never true  Transportation Needs: No Transportation Needs (09/24/2022)   PRAPARE - Administrator, Civil Service (Medical): No    Lack of Transportation (Non-Medical): No  Physical Activity: Insufficiently Active (09/24/2022)   Exercise Vital Sign    Days of Exercise per Week: 3 days    Minutes of Exercise per Session: 30 min  Stress: No Stress Concern Present (09/24/2022)   [employer]  of Occupational Health - Occupational Stress Questionnaire    Feeling of Stress : Not at all  Social Connections: Moderately Integrated (09/24/2022)   Social Connection and Isolation Panel [NHANES]    Frequency of Communication with Friends and Family: More than three times a week    Frequency of Social Gatherings with Friends and Family: More than three times a week    Attends Religious Services: More than 4 times per year    Active Member of [employer] or Organizations: Yes    Attends Engineer, structural: More than 4 times per year    Marital Status: Never married  Intimate Partner Violence: Not At Risk (09/24/2022)   Humiliation, Afraid, Rape, and Kick questionnaire    Fear of Current or Ex-Partner: No    Emotionally Abused: No    Physically Abused: No    Sexually Abused: No   Review of Systems No tinnitus No  vertigo No fever Some runny nose and congestion Some jaw pain Some cough    Objective:   Physical Exam Constitutional:      Appearance: Normal appearance.  HENT:     Head:     Comments: No sinus tenderness    Left Ear: Tympanic membrane and ear canal normal.     Ears:     Comments: Right canal occluded with cerumen No tragal tenderness or apparent inflammation    Mouth/Throat:     Pharynx: No oropharyngeal exudate or posterior oropharyngeal erythema.  Musculoskeletal:     Cervical back: Neck supple.  Lymphadenopathy:     Cervical: No cervical adenopathy.  Neurological:     Mental Status: He is alert.            Assessment & Plan:

## 2023-07-16 ENCOUNTER — Ambulatory Visit (INDEPENDENT_AMBULATORY_CARE_PROVIDER_SITE_OTHER): Payer: Medicare HMO | Admitting: Family

## 2023-07-16 VITALS — BP 108/70 | HR 105 | Temp 98.1°F | Ht 70.0 in | Wt 180.2 lb

## 2023-07-16 DIAGNOSIS — Z8661 Personal history of infections of the central nervous system: Secondary | ICD-10-CM | POA: Insufficient documentation

## 2023-07-16 DIAGNOSIS — R768 Other specified abnormal immunological findings in serum: Secondary | ICD-10-CM | POA: Diagnosis not present

## 2023-07-16 DIAGNOSIS — B009 Herpesviral infection, unspecified: Secondary | ICD-10-CM | POA: Diagnosis not present

## 2023-07-16 DIAGNOSIS — R7989 Other specified abnormal findings of blood chemistry: Secondary | ICD-10-CM

## 2023-07-16 DIAGNOSIS — H548 Legal blindness, as defined in USA: Secondary | ICD-10-CM | POA: Diagnosis not present

## 2023-07-16 DIAGNOSIS — H30131 Disseminated chorioretinal inflammation, generalized, right eye: Secondary | ICD-10-CM

## 2023-07-16 NOTE — Progress Notes (Signed)
Established Patient Office Visit  Subjective:      CC:  Chief Complaint  Patient presents with   Paperwork    Pt needs assistance filling out caregiver assistance paperwork.     HPI: Dakota Stanley is a 42 y.o. male presenting on 07/16/2023 for Paperwork (Pt needs assistance filling out caregiver assistance paperwork. ) . Legally blind, daughter is pretty much his caregiver at home per his   He states while driving at times hard for him to maneuver if there is a lot of vehicles around him, and he states he only drives for short distances as he can suddenly have blurry vision especially if driving longer distances. 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   His daughter currently helps him read due to intermittent blurry vision as well as helping him pick out his clothing and reminding him to eat.   Has had previous diagnosis of acute retinal necrosis as well as h/o meningitis. 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 Interim medical history since our last visit reviewed.  Allergies and medications reviewed and updated.  DATA REVIEWED: CHART IN EPIC     ROS: Negative unless specifically indicated above in HPI.    Current Outpatient Medications:    Multiple Vitamins-Minerals (MULTIVITAMIN WITH MINERALS) tablet, Take 1 tablet by mouth daily., Disp: , Rfl:    neomycin-polymyxin-hydrocortisone (CORTISPORIN) 3.5-10000-1 OTIC suspension, Place 4 drops into the right ear 4 (four) times daily., Disp: 20 mL, Rfl: 0   valACYclovir (VALTREX) 1000 MG tablet, TAKE 1 TABLET BY MOUTH EVERY DAY, Disp: 90 tablet, Rfl: 3      Objective:    BP 108/70   Pulse (!) 105   Temp 98.1 F (36.7 C) (Oral)    Ht 5\' 10"  (1.778 m)   Wt 180 lb 3.2 oz (81.7 kg)   SpO2 97%   BMI 25.86 kg/m   Wt Readings from Last 3 Encounters:  07/16/23 180 lb 3.2 oz (81.7 kg)  06/20/23 181 lb (82.1 kg)  12/31/22 189 lb 12.8 oz (86.1 kg)    Physical Exam Constitutional:      General: He is not in acute distress.    Appearance: Normal appearance. He is normal weight. He is not ill-appearing, toxic-appearing or diaphoretic.  Cardiovascular:     Rate and Rhythm: Normal rate.  Pulmonary:     Effort: Pulmonary effort is normal.  Musculoskeletal:        General: Normal range of motion.  Neurological:     General: No focal deficit present.     Mental Status: He is alert and oriented to person, place, and time. Mental status is at baseline.  Psychiatric:        Mood and Affect: Mood normal.        Behavior: Behavior normal.        Thought Content: Thought content normal.        Judgment: Judgment normal.           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  Followed by ophthalmologist yearly.  Will try to obtain records, and speak with ophthalmologist  Unsure if this meets criteria for medicaid CAP as pt able to perform IADLs only ADL pending is transportation to see if driving privileges need to be reconsidered, pending response from opthalmologist.     Acute retinal necrosis, right eye Assessment & Plan: H/o  With ophthalmology following.   Positive Lyme disease serology Assessment & Plan: Will repeat today but upon review of labs in 2018, negative for lyme disease with IGG and IGM   Orders: -     B. burgdorfi antibodies  Low testosterone Assessment & Plan: Evaluated by urology, determine low but insurance did not cover testosterone per  urology note.  Pt not taking otc at this time however would like to retest.   Orders: -     Testosterone Total,Free,Bio, Males  HSV-1 infection Assessment & Plan: Continue valtrex 1 g once daily       Return in about 1 year (around 07/15/2024) for f/u CPE.  Mort Sawyers, MSN, APRN, FNP-C [facility]

## 2023-07-16 NOTE — Assessment & Plan Note (Signed)
Evaluated by urology, determine low but insurance did not cover testosterone per urology note.  Pt not taking otc at this time however would like to retest.

## 2023-07-16 NOTE — Assessment & Plan Note (Signed)
H/o  With ophthalmology following.

## 2023-07-16 NOTE — Patient Instructions (Signed)
  Recommendation to start daily vitamin D3 over the counter 2000 I/U

## 2023-07-16 NOTE — Assessment & Plan Note (Signed)
Continue valtrex 1 g once daily

## 2023-07-16 NOTE — Assessment & Plan Note (Signed)
Will repeat today but upon review of labs in 2018, negative for lyme disease with IGG and IGM

## 2023-07-16 NOTE — Assessment & Plan Note (Addendum)
Ophthalmology report, Right eye - no vision Left eye - only central vision is preserved with reduced acuity H/o meningitis/HSV1 in 2011 which contributed to necrosis, and pt has been maintained on valtrex since.  Followed by ophthalmologist yearly.  Will try to obtain records, and speak with ophthalmologist  Unsure if this meets criteria for medicaid CAP as pt able to perform IADLs only ADL pending is transportation to see if driving privileges need to be reconsidered, pending response from opthalmologist.

## 2023-07-17 ENCOUNTER — Telehealth: Payer: Self-pay

## 2023-07-17 NOTE — Progress Notes (Signed)
noted 

## 2023-07-17 NOTE — Telephone Encounter (Signed)
Paperwork faxed to [phone] and a copy left at front for pt to pick up.  Called and made pt aware.

## 2023-07-17 NOTE — Telephone Encounter (Signed)
-----   Message from [place] sent at 07/16/2023  2:56 PM EDT ----- Form completed and in outbox.  Please complete office demographics and attach recent office note 8/13 encounter. Med list already attached.   Please fax to fax listed on page 2/3 Then please let pt know to pick up his original, we keep copy.

## 2023-08-30 DIAGNOSIS — K0401 Reversible pulpitis: Secondary | ICD-10-CM | POA: Diagnosis not present

## 2023-12-30 DIAGNOSIS — H27111 Subluxation of lens, right eye: Secondary | ICD-10-CM | POA: Diagnosis not present

## 2023-12-30 DIAGNOSIS — H30132 Disseminated chorioretinal inflammation, generalized, left eye: Secondary | ICD-10-CM | POA: Diagnosis not present

## 2023-12-30 DIAGNOSIS — H209 Unspecified iridocyclitis: Secondary | ICD-10-CM | POA: Diagnosis not present

## 2024-01-03 ENCOUNTER — Encounter: Payer: Medicare HMO | Admitting: Family

## 2024-01-08 ENCOUNTER — Ambulatory Visit: Payer: 59 | Admitting: Family

## 2024-01-08 VITALS — BP 134/74 | HR 87 | Temp 98.2°F | Ht 72.0 in | Wt 191.2 lb

## 2024-01-08 DIAGNOSIS — E782 Mixed hyperlipidemia: Secondary | ICD-10-CM | POA: Diagnosis not present

## 2024-01-08 DIAGNOSIS — E559 Vitamin D deficiency, unspecified: Secondary | ICD-10-CM

## 2024-01-08 DIAGNOSIS — H30131 Disseminated chorioretinal inflammation, generalized, right eye: Secondary | ICD-10-CM

## 2024-01-08 DIAGNOSIS — M5441 Lumbago with sciatica, right side: Secondary | ICD-10-CM | POA: Diagnosis not present

## 2024-01-08 DIAGNOSIS — R718 Other abnormality of red blood cells: Secondary | ICD-10-CM

## 2024-01-08 DIAGNOSIS — Q2112 Patent foramen ovale: Secondary | ICD-10-CM | POA: Insufficient documentation

## 2024-01-08 DIAGNOSIS — R739 Hyperglycemia, unspecified: Secondary | ICD-10-CM

## 2024-01-08 DIAGNOSIS — M5442 Lumbago with sciatica, left side: Secondary | ICD-10-CM | POA: Diagnosis not present

## 2024-01-08 DIAGNOSIS — H04121 Dry eye syndrome of right lacrimal gland: Secondary | ICD-10-CM

## 2024-01-08 DIAGNOSIS — G8929 Other chronic pain: Secondary | ICD-10-CM | POA: Insufficient documentation

## 2024-01-08 DIAGNOSIS — Z0000 Encounter for general adult medical examination without abnormal findings: Secondary | ICD-10-CM

## 2024-01-08 DIAGNOSIS — B009 Herpesviral infection, unspecified: Secondary | ICD-10-CM

## 2024-01-08 LAB — BASIC METABOLIC PANEL
BUN: 18 mg/dL (ref 6–23)
CO2: 28 meq/L (ref 19–32)
Calcium: 9.8 mg/dL (ref 8.4–10.5)
Chloride: 102 meq/L (ref 96–112)
Creatinine, Ser: 1.11 mg/dL (ref 0.40–1.50)
GFR: 80.85 mL/min (ref >60.00)
Glucose, Bld: 102 mg/dL — ABNORMAL HIGH (ref 70–99)
Potassium: 4 meq/L (ref 3.5–5.1)
Sodium: 139 meq/L (ref 135–145)

## 2024-01-08 LAB — LIPID PANEL
Cholesterol: 228 mg/dL — ABNORMAL HIGH (ref 0–200)
HDL: 50.7 mg/dL (ref >39.00)
LDL Cholesterol: 130 mg/dL — ABNORMAL HIGH (ref 0–99)
NonHDL: 176.9
Total CHOL/HDL Ratio: 4
Triglycerides: 236 mg/dL — ABNORMAL HIGH (ref 0.0–149.0)
VLDL: 47.2 mg/dL — ABNORMAL HIGH (ref 0.0–40.0)

## 2024-01-08 LAB — CBC
HCT: 44.9 % (ref 39.0–52.0)
Hemoglobin: 15 g/dL (ref 13.0–17.0)
MCHC: 33.5 g/dL (ref 30.0–36.0)
MCV: 89.9 fL (ref 78.0–100.0)
Platelets: 289 10*3/uL (ref 150.0–400.0)
RBC: 4.99 Mil/uL (ref 4.22–5.81)
RDW: 13.7 % (ref 11.5–15.5)
WBC: 4.8 10*3/uL (ref 4.0–10.5)

## 2024-01-08 LAB — VITAMIN B12: Vitamin B-12: 304 pg/mL (ref 211–911)

## 2024-01-08 LAB — VITAMIN D 25 HYDROXY (VIT D DEFICIENCY, FRACTURES): VITD: 18.79 ng/mL — ABNORMAL LOW (ref 30.00–100.00)

## 2024-01-08 LAB — HEMOGLOBIN A1C: Hgb A1c MFr Bld: 5.9 % (ref 4.6–6.5)

## 2024-01-08 MED ORDER — CYCLOBENZAPRINE HCL 5 MG PO TABS: 5.0000 mg | ORAL_TABLET | Freq: Every day | ORAL | 0 refills | Status: AC

## 2024-01-08 NOTE — Assessment & Plan Note (Signed)
 Ordered vitamin d pending results.

## 2024-01-08 NOTE — Assessment & Plan Note (Signed)
 Cont chronic suppressive therapy with valtrex 

## 2024-01-08 NOTE — Assessment & Plan Note (Signed)
 Followed by ophthalmology.

## 2024-01-08 NOTE — Assessment & Plan Note (Signed)

## 2024-01-08 NOTE — Assessment & Plan Note (Signed)
Sent in flexeril prn flare , advised of potential drowsiness with use Handout sent to mychart for stretches Heat/ice when flaring with stretching and massage as tolerated.  Nsaids/tylenol prn

## 2024-01-08 NOTE — Progress Notes (Signed)
 Subjective:  Patient ID: Dakota Stanley, male    DOB: 1981/07/11  Age: 43 y.o. MRN: [id number]  Patient Care Team: Corwin Antu, FNP as PCP - General (Family Medicine)   CC:  Chief Complaint  Patient presents with   Annual Exam    HPI Dakota Stanley is a 43 y.o. male who presents today for an annual physical exam. He reports consuming a general diet.  At times does exercise but not always trying to work on a routine.   He generally feels well. He reports sleeping fairly well. He does not have additional problems to discuss today.   Vision:Within last year Dental:Receives regular dental care  Lung Cancer Screening with low-dose Chest CT: n/a quit smoking marijuana in over one year.   Pt is with acute concerns.   C/o right eye dryness, has been to the eye doctor and they evaluated and told him to start artificial tears. Also was put on progressives which has helped with his vision when it comes to reading. He states his eye is still dry and irritated feeling even with the tears.   Back pains here and there will flare, will get pain on left lower leg but only intermittently flares with radiation of pain down the left lower leg. Has reported bil knee pain, worse in the cold. No ankle pain, hand pain and or foot pain.   In the past with incidental finding of PFO. No need for f/u as currently asymptomatic.   Advanced Directives Patient does not have advanced directives   DEPRESSION SCREENING    01/08/2024   10:55 AM 12/31/2022    2:19 PM 09/24/2022    9:03 AM 03/02/2021    9:23 AM 02/05/2020   10:39 AM 01/15/2019    8:57 AM 07/15/2018   10:18 AM  PHQ 2/9 Scores  PHQ - 2 Score 0 0 0 0 0 0 0  PHQ- 9 Score 0 1          ROS: Negative unless specifically indicated above in HPI.    Current Outpatient Medications:    cyclobenzaprine  (FLEXERIL ) 5 MG tablet, Take 1 tablet (5 mg total) by mouth at bedtime., Disp: 20 tablet, Rfl: 0   Multiple Vitamins-Minerals (MULTIVITAMIN WITH  MINERALS) tablet, Take 1 tablet by mouth daily., Disp: , Rfl:    valACYclovir  (VALTREX ) 1000 MG tablet, TAKE 1 TABLET BY MOUTH EVERY DAY, Disp: 90 tablet, Rfl: 3    Objective:    BP 134/74 (BP Location: Left Arm, Patient Position: Sitting, Cuff Size: Normal)   Pulse 87   Temp 98.2 F (36.8 C) (Temporal)   Ht 6' (1.829 m)   Wt 191 lb 3.2 oz (86.7 kg)   SpO2 95%   BMI 25.93 kg/m   BP Readings from Last 3 Encounters:  01/08/24 134/74  07/16/23 108/70  06/20/23 116/76      Physical Exam Vitals reviewed.  Constitutional:      General: He is not in acute distress.    Appearance: Normal appearance. He is normal weight. He is not ill-appearing, toxic-appearing or diaphoretic.  HENT:     Head: Normocephalic.     Right Ear: Tympanic membrane normal.     Left Ear: Tympanic membrane normal.     Nose: Nose normal.     Mouth/Throat:     Mouth: Mucous membranes are moist.  Eyes:     Pupils: Pupils are equal, round, and reactive to light.  Cardiovascular:     Rate and Rhythm:  Normal rate and regular rhythm.  Pulmonary:     Effort: Pulmonary effort is normal.     Breath sounds: Normal breath sounds.  Abdominal:     General: Abdomen is flat.     Tenderness: There is no abdominal tenderness.  Musculoskeletal:        General: Normal range of motion.  Skin:    General: Skin is warm.  Neurological:     General: No focal deficit present.     Mental Status: He is alert and oriented to person, place, and time. Mental status is at baseline.  Psychiatric:        Mood and Affect: Mood normal.        Behavior: Behavior normal.        Thought Content: Thought content normal.        Judgment: Judgment normal.          Assessment & Plan:  Chronic left-sided low back pain with bilateral sciatica Assessment & Plan: Sent in flexeril  prn flare , advised of potential drowsiness with use Handout sent to mychart for stretches Heat/ice when flaring with stretching and massage as  tolerated.  Nsaids/tylenol  prn   Orders: -     Cyclobenzaprine  HCl; Take 1 tablet (5 mg total) by mouth at bedtime.  Dispense: 20 tablet; Refill: 0  Elevated hematocrit Assessment & Plan: B12 ordered  Repeat cbc today pending results.   Orders: -     Vitamin B12  Hyperglycemia Assessment & Plan: Pt advised of the following: Work on a diabetic diet, try to incorporate exercise at least 20-30 a day for 3 days a week or more.  Repeat A1c today pending results  Orders: -     Hemoglobin A1c  PFO (patent foramen ovale) Assessment & Plan: Incidental finding No f/u needed  Monitor for new s/s doe, sob   Encounter for general adult medical examination without abnormal findings Assessment & Plan: Patient Counseling(The following topics were reviewed):  Preventative care handout given to pt  Health maintenance and immunizations reviewed. Please refer to Health maintenance section. Pt advised on safe sex, wearing seatbelts in car, and proper nutrition labwork ordered today for annual Dental health: Discussed importance of regular tooth brushing, flossing, and dental visits.   Orders: -     CBC -     Lipid panel -     Basic metabolic panel  Vitamin D  deficiency Assessment & Plan: Ordered vitamin d  pending results.    Orders: -     VITAMIN D  25 Hydroxy (Vit-D Deficiency, Fractures)  Mixed hyperlipidemia Assessment & Plan: Ordered lipid panel, pending results. Work on low cholesterol diet and exercise as tolerated   Orders: -     Lipid panel  Acute retinal necrosis, right eye Assessment & Plan: Followed by ophthalmology    Dry eye of right side Assessment & Plan: Continue otc drops F/u with ophthalmologist for alternative options as no real improvement   HSV-1 infection Assessment & Plan: Cont chronic suppressive therapy with valtrex        Follow-up: Return in about 1 year (around 01/07/2025) for f/u CPE.   Ginger Patrick, FNP

## 2024-01-08 NOTE — Assessment & Plan Note (Signed)
 Continue otc drops F/u with ophthalmologist for alternative options as no real improvement

## 2024-01-08 NOTE — Assessment & Plan Note (Signed)
 B12 ordered  Repeat cbc today pending results.

## 2024-01-08 NOTE — Assessment & Plan Note (Signed)
 Incidental finding No f/u needed  Monitor for new s/s doe, sob

## 2024-01-08 NOTE — Assessment & Plan Note (Signed)
 Pt advised of the following: Work on a diabetic diet, try to incorporate exercise at least 20-30 a day for 3 days a week or more.  Repeat A1c today pending results.

## 2024-01-08 NOTE — Assessment & Plan Note (Signed)
 Ordered lipid panel, pending results. Work on low cholesterol diet and exercise as tolerated

## 2024-01-14 ENCOUNTER — Other Ambulatory Visit: Payer: Self-pay | Admitting: Family

## 2024-01-14 ENCOUNTER — Encounter: Payer: Self-pay | Admitting: Family

## 2024-01-14 DIAGNOSIS — E559 Vitamin D deficiency, unspecified: Secondary | ICD-10-CM

## 2024-01-14 MED ORDER — CHOLECALCIFEROL 1.25 MG (50000 UT) PO TABS: 1.0000 | ORAL_TABLET | ORAL | 0 refills | Status: AC

## 2024-02-26 ENCOUNTER — Other Ambulatory Visit: Payer: Self-pay | Admitting: Family

## 2024-02-26 DIAGNOSIS — B009 Herpesviral infection, unspecified: Secondary | ICD-10-CM

## 2024-04-05 ENCOUNTER — Other Ambulatory Visit: Payer: Self-pay | Admitting: Family

## 2024-04-05 DIAGNOSIS — E559 Vitamin D deficiency, unspecified: Secondary | ICD-10-CM

## 2024-04-24 DIAGNOSIS — H30132 Disseminated chorioretinal inflammation, generalized, left eye: Secondary | ICD-10-CM | POA: Diagnosis not present

## 2024-04-24 DIAGNOSIS — H27111 Subluxation of lens, right eye: Secondary | ICD-10-CM | POA: Diagnosis not present

## 2024-04-24 DIAGNOSIS — G43109 Migraine with aura, not intractable, without status migrainosus: Secondary | ICD-10-CM | POA: Diagnosis not present

## 2025-01-11 ENCOUNTER — Encounter: Payer: 59 | Admitting: Family
